# Patient Record
Sex: Female | Born: 1945 | Race: White | Hispanic: No | Marital: Married | Smoking: Never smoker
Health system: Southern US, Community
[De-identification: ages and names within clinical notes are randomized; demographics above are authoritative.]

## PROBLEM LIST (undated history)

## (undated) DIAGNOSIS — M810 Age-related osteoporosis without current pathological fracture: Secondary | ICD-10-CM

## (undated) HISTORY — PX: OTHER SURGICAL HISTORY: SHX169

---

## 1999-07-23 ENCOUNTER — Other Ambulatory Visit: Admission: RE | Admit: 1999-07-23 | Discharge: 1999-07-23 | Payer: Self-pay | Admitting: *Deleted

## 2001-07-21 ENCOUNTER — Other Ambulatory Visit: Admission: RE | Admit: 2001-07-21 | Discharge: 2001-07-21 | Payer: Self-pay | Admitting: *Deleted

## 2006-08-03 ENCOUNTER — Encounter: Admission: RE | Admit: 2006-08-03 | Discharge: 2006-08-03 | Payer: Self-pay | Admitting: Family Medicine

## 2006-08-29 ENCOUNTER — Encounter: Admission: RE | Admit: 2006-08-29 | Discharge: 2006-08-29 | Payer: Self-pay | Admitting: Orthopedic Surgery

## 2007-05-17 ENCOUNTER — Encounter: Admission: RE | Admit: 2007-05-17 | Discharge: 2007-05-17 | Payer: Self-pay | Admitting: Family Medicine

## 2009-11-21 ENCOUNTER — Ambulatory Visit: Payer: Self-pay | Admitting: Oncology

## 2009-12-27 ENCOUNTER — Ambulatory Visit: Payer: Self-pay | Admitting: Oncology

## 2011-08-16 ENCOUNTER — Inpatient Hospital Stay (INDEPENDENT_AMBULATORY_CARE_PROVIDER_SITE_OTHER)
Admission: RE | Admit: 2011-08-16 | Discharge: 2011-08-16 | Disposition: A | Discharge status: Home / Self Care | Payer: BC Managed Care – PPO | Source: Ambulatory Visit | Attending: Emergency Medicine | Admitting: Emergency Medicine

## 2011-08-16 ENCOUNTER — Ambulatory Visit (INDEPENDENT_AMBULATORY_CARE_PROVIDER_SITE_OTHER): Payer: BC Managed Care – PPO

## 2011-08-16 DIAGNOSIS — S8000XA Contusion of unspecified knee, initial encounter: Secondary | ICD-10-CM

## 2013-08-19 ENCOUNTER — Encounter (HOSPITAL_COMMUNITY): Payer: Self-pay | Admitting: Emergency Medicine

## 2013-08-19 ENCOUNTER — Emergency Department (INDEPENDENT_AMBULATORY_CARE_PROVIDER_SITE_OTHER)
Admission: EM | Admit: 2013-08-19 | Discharge: 2013-08-19 | Disposition: A | Discharge status: Home / Self Care | Payer: Medicare Other | Source: Home / Self Care

## 2013-08-19 ENCOUNTER — Emergency Department (INDEPENDENT_AMBULATORY_CARE_PROVIDER_SITE_OTHER): Payer: Medicare Other

## 2013-08-19 DIAGNOSIS — M47814 Spondylosis without myelopathy or radiculopathy, thoracic region: Secondary | ICD-10-CM

## 2013-08-19 DIAGNOSIS — S139XXA Sprain of joints and ligaments of unspecified parts of neck, initial encounter: Secondary | ICD-10-CM

## 2013-08-19 DIAGNOSIS — S239XXA Sprain of unspecified parts of thorax, initial encounter: Secondary | ICD-10-CM

## 2013-08-19 DIAGNOSIS — M47812 Spondylosis without myelopathy or radiculopathy, cervical region: Secondary | ICD-10-CM

## 2013-08-19 DIAGNOSIS — S134XXA Sprain of ligaments of cervical spine, initial encounter: Secondary | ICD-10-CM

## 2013-08-19 MED ORDER — CYCLOBENZAPRINE HCL 10 MG PO TABS: 10.0000 mg | ORAL_TABLET | Freq: Two times a day (BID) | ORAL | Status: DC | PRN

## 2013-08-19 MED ORDER — NAPROXEN 500 MG PO TABS: ORAL_TABLET | ORAL | Status: DC
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
## 2013-08-23 NOTE — ED Provider Notes (Signed)
Medical screening examination/treatment/procedure(s) were performed by resident physician or non-physician practitioner and as supervising physician I was immediately available for consultation/collaboration.   Jeneen Doutt DOUGLAS MD.   Dixie Coppa D Tiyona Desouza, MD 08/23/13 1454 

## 2013-10-29 ENCOUNTER — Ambulatory Visit (INDEPENDENT_AMBULATORY_CARE_PROVIDER_SITE_OTHER): Payer: Medicare Other | Admitting: Internal Medicine

## 2013-10-29 VITALS — BP 180/90 | HR 90 | Temp 98.1°F | Resp 18 | Ht 62.0 in | Wt 191.0 lb

## 2013-10-29 DIAGNOSIS — R519 Headache, unspecified: Secondary | ICD-10-CM

## 2013-10-29 DIAGNOSIS — R202 Paresthesia of skin: Secondary | ICD-10-CM

## 2013-10-29 DIAGNOSIS — R51 Headache: Secondary | ICD-10-CM

## 2013-10-29 DIAGNOSIS — L02219 Cutaneous abscess of trunk, unspecified: Secondary | ICD-10-CM

## 2013-10-29 DIAGNOSIS — L02215 Cutaneous abscess of perineum: Secondary | ICD-10-CM

## 2013-10-29 DIAGNOSIS — R209 Unspecified disturbances of skin sensation: Secondary | ICD-10-CM

## 2013-10-29 MED ORDER — DOXYCYCLINE HYCLATE 100 MG PO TABS: 100.0000 mg | ORAL_TABLET | Freq: Two times a day (BID) | ORAL | Status: DC

## 2013-10-29 NOTE — Progress Notes (Signed)
Subjective:    Patient ID: Jo Simpson, female    DOB: 1946/11/08, 67 y.o.   MRN: [id number]  This chart was scribed for Ellamae Sia, MD by Greggory Stallion, Medical Scribe. This patient's care was started at 5:09 PM.  HPI HPI Comments: Jo Simpson is a 67 y.o. female who presents to the office complaining of sudden onset headache that started 2 days ago-R frontotemp. No dizz/vis chg/nausea/vom.No hx HAs. Lasted 1-2 hrs. She states it went away on it's own. Denies fever, cough, SOB, dizziness, nausea, visual disturbance. She also noted bilateral hand pain, shoulder pain, burning left hip pain and neck pain, and She states she had temporary tingling in her bilateral lower/upper extremities. All of this resolved on Friday.  Pt states she also has an abscess in the perineal area x 1 week. She states she scratched it and the abscess popped and drained. Pt putting antibiotic ointment on it.  She states she is also having a mild rash on her right axilla.not pruritic or painful.   There are no active problems to display for this patient.  History reviewed. No pertinent past medical history. Past Surgical History  Procedure Laterality Date   Tonsilittis     Allergies  Allergen Reactions   Sulfa Antibiotics    Prior to Admission medications   Medication Sig Start Date End Date Taking? Authorizing Provider  ibuprofen (ADVIL,MOTRIN) 800 MG tablet Take 800 mg by mouth every 8 (eight) hours as needed for pain.   Yes Historical Provider, MD  naproxen (NAPROSYN) 500 MG tablet 1 tab po bid pc x 7 days then prn neck or back pain 08/19/13   Riki Sheer, PA-C   History   Social History   Marital Status: Married    Spouse Name: N/A    Number of Children: N/A   Years of Education: N/A   Occupational History   Not on file.   Social History Main Topics   Smoking status: Never Smoker    Smokeless tobacco: Not on file   Alcohol Use: No   Drug Use: No   Sexual Activity:  No   Other Topics Concern   Not on file   Social History Narrative   No narrative on file    Review of Systems  Constitutional: Negative for fever.  Eyes: Negative for visual disturbance.  Respiratory: Negative for cough and shortness of breath.   Gastrointestinal: Negative for nausea.  Musculoskeletal: Positive for arthralgias and neck pain.  Skin: Positive for rash.       Abscess  Neurological: Positive for headaches. Negative for dizziness.       Objective:   Physical Exam  Vitals reviewed. Constitutional: She is oriented to person, place, and time. She appears well-developed and well-nourished. No distress.  HENT:  Head: Normocephalic and atraumatic.  Eyes: Conjunctivae and EOM are normal. Pupils are equal, round, and reactive to light.  Neck: Neck supple. No thyromegaly present.  Cardiovascular: Normal rate, regular rhythm, normal heart sounds and intact distal pulses.   No murmur heard. Pulmonary/Chest: Effort normal and breath sounds normal. No respiratory distress.  Abdominal: Soft. Bowel sounds are normal. She exhibits no mass. There is no tenderness.  Musculoskeletal: Normal range of motion. She exhibits no edema.  Lymphadenopathy:    She has no cervical adenopathy.  Neurological: She is alert and oriented to person, place, and time. She has normal reflexes. No cranial nerve deficit. She exhibits normal muscle tone. Coordination normal.  Skin: Skin is  warm and dry.  1 cm open, draining lesion on perineum that is slightly tender.   Psychiatric: Her behavior is normal. Thought content normal.  anxious  no rash in axilla.  Filed Vitals:   10/29/13 1646  BP: 180/90  Pulse: 90  Temp: 98.1 F (36.7 C)  TempSrc: Oral  Resp: 18  Height: 5\' 2"  (1.575 m)  Weight: 191 lb (86.637 kg)  SpO2: 97%      Assessment & Plan:  5:18 PM-Discussed treatment plan which includes an antibiotic with pt at bedside and pt agreed to plan.    I have completed the patient  encounter in its entirety as documented by the scribe, with editing by me where necessary. Robert P. Merla Riches, M.D.   Abscess, perineum--doxy/hot soaks  HA (headache)--f/u if recurs  Paresthesia-ditto  Incr BP ?anxious---reck when asymptomatic

## 2013-11-06 ENCOUNTER — Ambulatory Visit (HOSPITAL_COMMUNITY)
Admission: RE | Admit: 2013-11-06 | Discharge: 2013-11-06 | Disposition: A | Discharge status: Home / Self Care | Payer: Medicare Other | Source: Ambulatory Visit | Attending: Chiropractic Medicine | Admitting: Chiropractic Medicine

## 2013-11-06 ENCOUNTER — Other Ambulatory Visit (HOSPITAL_COMMUNITY): Payer: Self-pay | Admitting: Chiropractic Medicine

## 2013-11-06 DIAGNOSIS — M25531 Pain in right wrist: Secondary | ICD-10-CM

## 2013-11-06 DIAGNOSIS — M25532 Pain in left wrist: Secondary | ICD-10-CM

## 2013-11-06 DIAGNOSIS — M810 Age-related osteoporosis without current pathological fracture: Secondary | ICD-10-CM | POA: Insufficient documentation

## 2013-11-06 DIAGNOSIS — M19039 Primary osteoarthritis, unspecified wrist: Secondary | ICD-10-CM | POA: Insufficient documentation

## 2014-09-14 ENCOUNTER — Other Ambulatory Visit: Payer: Self-pay
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
## 2015-02-08 ENCOUNTER — Emergency Department (HOSPITAL_COMMUNITY)
Admission: EM | Admit: 2015-02-08 | Discharge: 2015-02-08 | Disposition: A | Discharge status: Home / Self Care | Payer: PRIVATE HEALTH INSURANCE | Source: Home / Self Care | Attending: Family Medicine | Admitting: Family Medicine

## 2015-02-08 ENCOUNTER — Encounter (HOSPITAL_COMMUNITY): Payer: Self-pay

## 2015-02-08 DIAGNOSIS — S0081XA Abrasion of other part of head, initial encounter: Secondary | ICD-10-CM | POA: Diagnosis not present

## 2015-02-08 DIAGNOSIS — W19XXXA Unspecified fall, initial encounter: Secondary | ICD-10-CM

## 2015-02-08 MED ORDER — TETANUS-DIPHTH-ACELL PERTUSSIS 5-2.5-18.5 LF-MCG/0.5 IM SUSP
0.5000 mL | Freq: Once | INTRAMUSCULAR | Status: AC
  Administered 2015-02-08: 0.5 mL via INTRAMUSCULAR

## 2015-02-08 MED ORDER — TETANUS-DIPHTH-ACELL PERTUSSIS 5-2.5-18.5 LF-MCG/0.5 IM SUSP
INTRAMUSCULAR | Status: AC
  Filled 2015-02-08: qty 0.5

## 2015-02-08 NOTE — Discharge Instructions (Signed)
Keep chin clean with soap and water gently, use neosporin, you had a tetanus booster, return as needed.

## 2015-02-08 NOTE — ED Provider Notes (Signed)
CSN: [id number]     Arrival date & time 02/08/15  1212 History   First MD Initiated Contact with Patient 02/08/15 1343     No chief complaint on file.  (Consider location/radiation/quality/duration/timing/severity/associated sxs/prior Treatment) Patient is a 69 y.o. female presenting with fall. The history is provided by the patient.  Fall This is a new problem. The current episode started 1 to 2 hours ago (tripped on curb while walking dogs and fell onto chin , knee and hands., had bleeding from chin.). The problem has been gradually improving. Pertinent negatives include no chest pain, no abdominal pain, no headaches and no shortness of breath.    History reviewed. No pertinent past medical history. Past Surgical History  Procedure Laterality Date  . Tonsilittis     Family History  Problem Relation Age of Onset  . Heart disease Father    History  Substance Use Topics  . Smoking status: Never Smoker   . Smokeless tobacco: Not on file  . Alcohol Use: No   OB History    No data available     Review of Systems  Constitutional: Negative.   Respiratory: Negative for shortness of breath.   Cardiovascular: Negative for chest pain.  Gastrointestinal: Negative for abdominal pain.  Musculoskeletal: Negative for back pain and neck pain.  Skin: Positive for wound.  Neurological: Negative for dizziness, light-headedness and headaches.    Allergies  Sulfa antibiotics  Home Medications   Prior to Admission medications   Medication Sig Start Date End Date Taking? Authorizing Provider  doxycycline (VIBRA-TABS) 100 MG tablet Take 1 tablet (100 mg total) by mouth 2 (two) times daily. 10/29/13   Tonye Pearsonobert P Doolittle, MD  ibuprofen (ADVIL,MOTRIN) 800 MG tablet Take 800 mg by mouth every 8 (eight) hours as needed for pain.    Historical Provider, MD  naproxen (NAPROSYN) 500 MG tablet 1 tab po bid pc x 7 days then prn neck or back pain 08/19/13   Riki SheerMichelle G Young, PA-C   BP 169/96 mmHg   Pulse 93  Temp(Src) 98.2 F (36.8 C) (Oral)  Resp 14  SpO2 98% Physical Exam  Constitutional: She is oriented to person, place, and time. She appears well-developed and well-nourished.  HENT:  Head: Normocephalic.  Mouth/Throat: Oropharynx is clear and moist.  1mm abrasion to chin , no bleeding, no fb palpated or seen, teeth and mandible intact, no tmj pain..  Neck: Normal range of motion. Neck supple.  Pulmonary/Chest: She exhibits no tenderness.  Abdominal: There is no tenderness.  Musculoskeletal: She exhibits no tenderness.  Neurological: She is alert and oriented to person, place, and time.  Skin: Skin is warm and dry.  Nursing note and vitals reviewed.   ED Course  Procedures (including critical care time) Labs Review Labs Reviewed - No data to display  Imaging Review No results found.   MDM   1. Fall, initial encounter       Linna HoffJames D Cordae Mccarey, MD 02/08/15 1431

## 2015-02-08 NOTE — ED Notes (Signed)
Reportedly fell this AM while out walking her 2 dogs , impacting her chin. Small abrasion noted . C/o some pain in both hands, both knees.pain in right TMJ area, but opens/closes mouth freely  Ambulates w/o observable deficit denies LOC

## 2015-05-27 ENCOUNTER — Other Ambulatory Visit: Payer: Self-pay

## 2019-08-29 ENCOUNTER — Other Ambulatory Visit: Payer: Self-pay

## 2019-08-29 DIAGNOSIS — Z20822 Contact with and (suspected) exposure to covid-19: Secondary | ICD-10-CM

## 2019-08-30 LAB — NOVEL CORONAVIRUS, NAA: SARS-CoV-2, NAA: NOT DETECTED

## 2019-12-25 ENCOUNTER — Ambulatory Visit: Payer: Medicare PPO | Attending: Internal Medicine

## 2019-12-25 DIAGNOSIS — Z23 Encounter for immunization: Secondary | ICD-10-CM | POA: Insufficient documentation

## 2019-12-25 NOTE — Progress Notes (Signed)
   Covid-19 Vaccination Clinic  Name:  Jo Simpson    MRN: [id number] DOB: 10/17/1946  12/25/2019  Jo Simpson was observed post Covid-19 immunization for 15 minutes without incidence. She was provided with Vaccine Information Sheet and instruction to access the V-Safe system.   Jo Simpson was instructed to call 911 with any severe reactions post vaccine: Marland Kitchen Difficulty breathing  . Swelling of your face and throat  . A fast heartbeat  . A bad rash all over your body  . Dizziness and weakness    Immunizations Administered    Name Date Dose VIS Date Route   Pfizer COVID-19 Vaccine 12/25/2019 10:10 AM 0.3 mL 11/10/2019 Intramuscular   Manufacturer: [employer], [employer]   Lot: [id number]   NDC: [address]-4

## 2020-01-15 ENCOUNTER — Ambulatory Visit: Payer: Medicare PPO | Attending: Internal Medicine

## 2020-01-15 DIAGNOSIS — Z23 Encounter for immunization: Secondary | ICD-10-CM | POA: Insufficient documentation

## 2020-01-15 NOTE — Progress Notes (Signed)
   Covid-19 Vaccination Clinic  Name:  Jo Simpson    MRN: [id number] DOB: 07/04/1946  01/15/2020  [name] was observed post Covid-19 immunization for 15 minutes without incidence. She was provided with Vaccine Information Sheet and instruction to access the V-Safe system.   [name] was instructed to call 911 with any severe reactions post vaccine: Marland Kitchen Difficulty breathing  . Swelling of your face and throat  . A fast heartbeat  . A bad rash all over your body  . Dizziness and weakness    Immunizations Administered    Name Date Dose VIS Date Route   Pfizer COVID-19 Vaccine 01/15/2020 10:33 AM 0.3 mL 11/10/2019 Intramuscular   Manufacturer: [employer], [employer]   Lot: [id number]   NDC: [address]-3

## 2020-08-20 DIAGNOSIS — L814 Other melanin hyperpigmentation: Secondary | ICD-10-CM | POA: Diagnosis not present

## 2020-08-20 DIAGNOSIS — Z86018 Personal history of other benign neoplasm: Secondary | ICD-10-CM | POA: Diagnosis not present

## 2020-08-20 DIAGNOSIS — L578 Other skin changes due to chronic exposure to nonionizing radiation: Secondary | ICD-10-CM | POA: Diagnosis not present

## 2020-08-20 DIAGNOSIS — L821 Other seborrheic keratosis: Secondary | ICD-10-CM | POA: Diagnosis not present

## 2020-08-20 DIAGNOSIS — B353 Tinea pedis: Secondary | ICD-10-CM | POA: Diagnosis not present

## 2020-08-20 DIAGNOSIS — D225 Melanocytic nevi of trunk: Secondary | ICD-10-CM | POA: Diagnosis not present

## 2020-09-07 DIAGNOSIS — Z23 Encounter for immunization: Secondary | ICD-10-CM | POA: Diagnosis not present

## 2020-09-11 DIAGNOSIS — M9903 Segmental and somatic dysfunction of lumbar region: Secondary | ICD-10-CM | POA: Diagnosis not present

## 2020-09-11 DIAGNOSIS — M5432 Sciatica, left side: Secondary | ICD-10-CM | POA: Diagnosis not present

## 2020-09-12 DIAGNOSIS — M9903 Segmental and somatic dysfunction of lumbar region: Secondary | ICD-10-CM | POA: Diagnosis not present

## 2020-09-12 DIAGNOSIS — M5432 Sciatica, left side: Secondary | ICD-10-CM | POA: Diagnosis not present

## 2020-09-16 DIAGNOSIS — M9903 Segmental and somatic dysfunction of lumbar region: Secondary | ICD-10-CM | POA: Diagnosis not present

## 2020-09-16 DIAGNOSIS — M5432 Sciatica, left side: Secondary | ICD-10-CM | POA: Diagnosis not present

## 2020-09-17 DIAGNOSIS — M5432 Sciatica, left side: Secondary | ICD-10-CM | POA: Diagnosis not present

## 2020-09-17 DIAGNOSIS — M9903 Segmental and somatic dysfunction of lumbar region: Secondary | ICD-10-CM | POA: Diagnosis not present

## 2020-09-18 DIAGNOSIS — M9903 Segmental and somatic dysfunction of lumbar region: Secondary | ICD-10-CM | POA: Diagnosis not present

## 2020-09-18 DIAGNOSIS — M5432 Sciatica, left side: Secondary | ICD-10-CM | POA: Diagnosis not present

## 2020-09-23 DIAGNOSIS — M5432 Sciatica, left side: Secondary | ICD-10-CM | POA: Diagnosis not present

## 2020-09-23 DIAGNOSIS — M9903 Segmental and somatic dysfunction of lumbar region: Secondary | ICD-10-CM | POA: Diagnosis not present

## 2020-09-25 DIAGNOSIS — M9903 Segmental and somatic dysfunction of lumbar region: Secondary | ICD-10-CM | POA: Diagnosis not present

## 2020-09-25 DIAGNOSIS — M5432 Sciatica, left side: Secondary | ICD-10-CM | POA: Diagnosis not present

## 2020-09-30 DIAGNOSIS — M9903 Segmental and somatic dysfunction of lumbar region: Secondary | ICD-10-CM | POA: Diagnosis not present

## 2020-09-30 DIAGNOSIS — M5432 Sciatica, left side: Secondary | ICD-10-CM | POA: Diagnosis not present

## 2020-10-03 DIAGNOSIS — M9903 Segmental and somatic dysfunction of lumbar region: Secondary | ICD-10-CM | POA: Diagnosis not present

## 2020-10-03 DIAGNOSIS — M5432 Sciatica, left side: Secondary | ICD-10-CM | POA: Diagnosis not present

## 2020-10-07 DIAGNOSIS — R350 Frequency of micturition: Secondary | ICD-10-CM | POA: Diagnosis not present

## 2020-10-07 DIAGNOSIS — M9903 Segmental and somatic dysfunction of lumbar region: Secondary | ICD-10-CM | POA: Diagnosis not present

## 2020-10-07 DIAGNOSIS — M5432 Sciatica, left side: Secondary | ICD-10-CM | POA: Diagnosis not present

## 2020-10-10 DIAGNOSIS — M9903 Segmental and somatic dysfunction of lumbar region: Secondary | ICD-10-CM | POA: Diagnosis not present

## 2020-10-10 DIAGNOSIS — M5432 Sciatica, left side: Secondary | ICD-10-CM | POA: Diagnosis not present

## 2020-10-15 DIAGNOSIS — M5432 Sciatica, left side: Secondary | ICD-10-CM | POA: Diagnosis not present

## 2020-10-15 DIAGNOSIS — M9903 Segmental and somatic dysfunction of lumbar region: Secondary | ICD-10-CM | POA: Diagnosis not present

## 2020-11-12 DIAGNOSIS — M5432 Sciatica, left side: Secondary | ICD-10-CM | POA: Diagnosis not present

## 2020-11-12 DIAGNOSIS — M9903 Segmental and somatic dysfunction of lumbar region: Secondary | ICD-10-CM | POA: Diagnosis not present

## 2020-12-09 DIAGNOSIS — M5432 Sciatica, left side: Secondary | ICD-10-CM | POA: Diagnosis not present

## 2020-12-09 DIAGNOSIS — M9903 Segmental and somatic dysfunction of lumbar region: Secondary | ICD-10-CM | POA: Diagnosis not present

## 2020-12-27 DIAGNOSIS — E782 Mixed hyperlipidemia: Secondary | ICD-10-CM | POA: Diagnosis not present

## 2020-12-27 DIAGNOSIS — R030 Elevated blood-pressure reading, without diagnosis of hypertension: Secondary | ICD-10-CM | POA: Diagnosis not present

## 2021-01-07 DIAGNOSIS — M9903 Segmental and somatic dysfunction of lumbar region: Secondary | ICD-10-CM | POA: Diagnosis not present

## 2021-01-07 DIAGNOSIS — M5432 Sciatica, left side: Secondary | ICD-10-CM | POA: Diagnosis not present

## 2021-01-22 DIAGNOSIS — Z1231 Encounter for screening mammogram for malignant neoplasm of breast: Secondary | ICD-10-CM | POA: Diagnosis not present

## 2021-02-18 DIAGNOSIS — M5432 Sciatica, left side: Secondary | ICD-10-CM | POA: Diagnosis not present

## 2021-02-18 DIAGNOSIS — M9903 Segmental and somatic dysfunction of lumbar region: Secondary | ICD-10-CM | POA: Diagnosis not present

## 2021-03-04 DIAGNOSIS — R748 Abnormal levels of other serum enzymes: Secondary | ICD-10-CM | POA: Diagnosis not present

## 2021-03-04 DIAGNOSIS — Z1211 Encounter for screening for malignant neoplasm of colon: Secondary | ICD-10-CM | POA: Diagnosis not present

## 2021-03-04 DIAGNOSIS — K5730 Diverticulosis of large intestine without perforation or abscess without bleeding: Secondary | ICD-10-CM | POA: Diagnosis not present

## 2021-03-19 DIAGNOSIS — Z1211 Encounter for screening for malignant neoplasm of colon: Secondary | ICD-10-CM | POA: Diagnosis not present

## 2021-03-19 DIAGNOSIS — Z1212 Encounter for screening for malignant neoplasm of rectum: Secondary | ICD-10-CM | POA: Diagnosis not present

## 2021-03-24 LAB — EXTERNAL GENERIC LAB PROCEDURE: COLOGUARD: NEGATIVE

## 2021-03-24 LAB — COLOGUARD: COLOGUARD: NEGATIVE

## 2021-04-01 DIAGNOSIS — M9903 Segmental and somatic dysfunction of lumbar region: Secondary | ICD-10-CM | POA: Diagnosis not present

## 2021-04-01 DIAGNOSIS — M5432 Sciatica, left side: Secondary | ICD-10-CM | POA: Diagnosis not present

## 2021-04-03 DIAGNOSIS — H2513 Age-related nuclear cataract, bilateral: Secondary | ICD-10-CM | POA: Diagnosis not present

## 2021-04-03 DIAGNOSIS — H5203 Hypermetropia, bilateral: Secondary | ICD-10-CM | POA: Diagnosis not present

## 2021-04-03 DIAGNOSIS — H524 Presbyopia: Secondary | ICD-10-CM | POA: Diagnosis not present

## 2021-04-03 DIAGNOSIS — H53001 Unspecified amblyopia, right eye: Secondary | ICD-10-CM | POA: Diagnosis not present

## 2021-04-03 DIAGNOSIS — H52223 Regular astigmatism, bilateral: Secondary | ICD-10-CM | POA: Diagnosis not present

## 2021-05-13 DIAGNOSIS — M5432 Sciatica, left side: Secondary | ICD-10-CM | POA: Diagnosis not present

## 2021-05-13 DIAGNOSIS — M9903 Segmental and somatic dysfunction of lumbar region: Secondary | ICD-10-CM | POA: Diagnosis not present

## 2021-05-16 DIAGNOSIS — Z20822 Contact with and (suspected) exposure to covid-19: Secondary | ICD-10-CM | POA: Diagnosis not present

## 2021-06-16 DIAGNOSIS — Z1389 Encounter for screening for other disorder: Secondary | ICD-10-CM | POA: Diagnosis not present

## 2021-06-16 DIAGNOSIS — Z0000 Encounter for general adult medical examination without abnormal findings: Secondary | ICD-10-CM | POA: Diagnosis not present

## 2021-06-16 DIAGNOSIS — E782 Mixed hyperlipidemia: Secondary | ICD-10-CM | POA: Diagnosis not present

## 2021-06-17 DIAGNOSIS — M9903 Segmental and somatic dysfunction of lumbar region: Secondary | ICD-10-CM | POA: Diagnosis not present

## 2021-06-17 DIAGNOSIS — M5432 Sciatica, left side: Secondary | ICD-10-CM | POA: Diagnosis not present

## 2021-06-21 DIAGNOSIS — Z20822 Contact with and (suspected) exposure to covid-19: Secondary | ICD-10-CM | POA: Diagnosis not present

## 2021-07-22 DIAGNOSIS — M9903 Segmental and somatic dysfunction of lumbar region: Secondary | ICD-10-CM | POA: Diagnosis not present

## 2021-07-22 DIAGNOSIS — M5432 Sciatica, left side: Secondary | ICD-10-CM | POA: Diagnosis not present

## 2021-08-19 DIAGNOSIS — M9903 Segmental and somatic dysfunction of lumbar region: Secondary | ICD-10-CM | POA: Diagnosis not present

## 2021-08-19 DIAGNOSIS — M5432 Sciatica, left side: Secondary | ICD-10-CM | POA: Diagnosis not present

## 2021-08-20 DIAGNOSIS — Z86018 Personal history of other benign neoplasm: Secondary | ICD-10-CM | POA: Diagnosis not present

## 2021-08-20 DIAGNOSIS — L814 Other melanin hyperpigmentation: Secondary | ICD-10-CM | POA: Diagnosis not present

## 2021-08-20 DIAGNOSIS — L821 Other seborrheic keratosis: Secondary | ICD-10-CM | POA: Diagnosis not present

## 2021-08-20 DIAGNOSIS — L578 Other skin changes due to chronic exposure to nonionizing radiation: Secondary | ICD-10-CM | POA: Diagnosis not present

## 2021-08-20 DIAGNOSIS — D225 Melanocytic nevi of trunk: Secondary | ICD-10-CM | POA: Diagnosis not present

## 2021-08-20 DIAGNOSIS — L820 Inflamed seborrheic keratosis: Secondary | ICD-10-CM | POA: Diagnosis not present

## 2021-09-15 DIAGNOSIS — M9903 Segmental and somatic dysfunction of lumbar region: Secondary | ICD-10-CM | POA: Diagnosis not present

## 2021-09-15 DIAGNOSIS — M5432 Sciatica, left side: Secondary | ICD-10-CM | POA: Diagnosis not present

## 2021-09-17 DIAGNOSIS — M9903 Segmental and somatic dysfunction of lumbar region: Secondary | ICD-10-CM | POA: Diagnosis not present

## 2021-09-17 DIAGNOSIS — M5432 Sciatica, left side: Secondary | ICD-10-CM | POA: Diagnosis not present

## 2021-09-22 DIAGNOSIS — M9903 Segmental and somatic dysfunction of lumbar region: Secondary | ICD-10-CM | POA: Diagnosis not present

## 2021-09-22 DIAGNOSIS — M5432 Sciatica, left side: Secondary | ICD-10-CM | POA: Diagnosis not present

## 2021-09-25 DIAGNOSIS — M9903 Segmental and somatic dysfunction of lumbar region: Secondary | ICD-10-CM | POA: Diagnosis not present

## 2021-09-25 DIAGNOSIS — M5432 Sciatica, left side: Secondary | ICD-10-CM | POA: Diagnosis not present

## 2021-09-29 DIAGNOSIS — H9311 Tinnitus, right ear: Secondary | ICD-10-CM | POA: Diagnosis not present

## 2021-09-29 DIAGNOSIS — H6123 Impacted cerumen, bilateral: Secondary | ICD-10-CM | POA: Diagnosis not present

## 2021-09-30 DIAGNOSIS — M9903 Segmental and somatic dysfunction of lumbar region: Secondary | ICD-10-CM | POA: Diagnosis not present

## 2021-09-30 DIAGNOSIS — M5432 Sciatica, left side: Secondary | ICD-10-CM | POA: Diagnosis not present

## 2021-10-04 DIAGNOSIS — Z23 Encounter for immunization: Secondary | ICD-10-CM | POA: Diagnosis not present

## 2021-10-07 DIAGNOSIS — M9903 Segmental and somatic dysfunction of lumbar region: Secondary | ICD-10-CM | POA: Diagnosis not present

## 2021-10-07 DIAGNOSIS — M5432 Sciatica, left side: Secondary | ICD-10-CM | POA: Diagnosis not present

## 2021-10-14 DIAGNOSIS — M9903 Segmental and somatic dysfunction of lumbar region: Secondary | ICD-10-CM | POA: Diagnosis not present

## 2021-10-14 DIAGNOSIS — M5432 Sciatica, left side: Secondary | ICD-10-CM | POA: Diagnosis not present

## 2021-10-28 DIAGNOSIS — M9903 Segmental and somatic dysfunction of lumbar region: Secondary | ICD-10-CM | POA: Diagnosis not present

## 2021-10-28 DIAGNOSIS — M5432 Sciatica, left side: Secondary | ICD-10-CM | POA: Diagnosis not present

## 2021-11-11 DIAGNOSIS — M5432 Sciatica, left side: Secondary | ICD-10-CM | POA: Diagnosis not present

## 2021-11-11 DIAGNOSIS — M9903 Segmental and somatic dysfunction of lumbar region: Secondary | ICD-10-CM | POA: Diagnosis not present

## 2021-12-02 DIAGNOSIS — M5432 Sciatica, left side: Secondary | ICD-10-CM | POA: Diagnosis not present

## 2021-12-02 DIAGNOSIS — M9903 Segmental and somatic dysfunction of lumbar region: Secondary | ICD-10-CM | POA: Diagnosis not present

## 2021-12-16 DIAGNOSIS — M9903 Segmental and somatic dysfunction of lumbar region: Secondary | ICD-10-CM | POA: Diagnosis not present

## 2021-12-16 DIAGNOSIS — M5432 Sciatica, left side: Secondary | ICD-10-CM | POA: Diagnosis not present

## 2021-12-30 DIAGNOSIS — M5432 Sciatica, left side: Secondary | ICD-10-CM | POA: Diagnosis not present

## 2021-12-30 DIAGNOSIS — M9903 Segmental and somatic dysfunction of lumbar region: Secondary | ICD-10-CM | POA: Diagnosis not present

## 2022-01-20 DIAGNOSIS — M9903 Segmental and somatic dysfunction of lumbar region: Secondary | ICD-10-CM | POA: Diagnosis not present

## 2022-01-20 DIAGNOSIS — M5432 Sciatica, left side: Secondary | ICD-10-CM | POA: Diagnosis not present

## 2022-01-28 DIAGNOSIS — Z1231 Encounter for screening mammogram for malignant neoplasm of breast: Secondary | ICD-10-CM | POA: Diagnosis not present

## 2022-02-10 DIAGNOSIS — M9903 Segmental and somatic dysfunction of lumbar region: Secondary | ICD-10-CM | POA: Diagnosis not present

## 2022-02-10 DIAGNOSIS — M5432 Sciatica, left side: Secondary | ICD-10-CM | POA: Diagnosis not present

## 2022-03-03 DIAGNOSIS — M5432 Sciatica, left side: Secondary | ICD-10-CM | POA: Diagnosis not present

## 2022-03-03 DIAGNOSIS — M9903 Segmental and somatic dysfunction of lumbar region: Secondary | ICD-10-CM | POA: Diagnosis not present

## 2022-03-25 DIAGNOSIS — M9903 Segmental and somatic dysfunction of lumbar region: Secondary | ICD-10-CM | POA: Diagnosis not present

## 2022-03-25 DIAGNOSIS — M5432 Sciatica, left side: Secondary | ICD-10-CM | POA: Diagnosis not present

## 2022-04-22 DIAGNOSIS — M5432 Sciatica, left side: Secondary | ICD-10-CM | POA: Diagnosis not present

## 2022-04-22 DIAGNOSIS — M9903 Segmental and somatic dysfunction of lumbar region: Secondary | ICD-10-CM | POA: Diagnosis not present

## 2022-06-25 DIAGNOSIS — Z1331 Encounter for screening for depression: Secondary | ICD-10-CM | POA: Diagnosis not present

## 2022-06-25 DIAGNOSIS — M8588 Other specified disorders of bone density and structure, other site: Secondary | ICD-10-CM | POA: Diagnosis not present

## 2022-06-25 DIAGNOSIS — E782 Mixed hyperlipidemia: Secondary | ICD-10-CM | POA: Diagnosis not present

## 2022-06-25 DIAGNOSIS — Z0000 Encounter for general adult medical examination without abnormal findings: Secondary | ICD-10-CM | POA: Diagnosis not present

## 2022-06-25 DIAGNOSIS — I499 Cardiac arrhythmia, unspecified: Secondary | ICD-10-CM | POA: Diagnosis not present

## 2022-06-25 DIAGNOSIS — R030 Elevated blood-pressure reading, without diagnosis of hypertension: Secondary | ICD-10-CM | POA: Diagnosis not present

## 2022-08-18 DIAGNOSIS — H52223 Regular astigmatism, bilateral: Secondary | ICD-10-CM | POA: Diagnosis not present

## 2022-08-18 DIAGNOSIS — H53143 Visual discomfort, bilateral: Secondary | ICD-10-CM | POA: Diagnosis not present

## 2022-08-18 DIAGNOSIS — H524 Presbyopia: Secondary | ICD-10-CM | POA: Diagnosis not present

## 2022-08-18 DIAGNOSIS — H53001 Unspecified amblyopia, right eye: Secondary | ICD-10-CM | POA: Diagnosis not present

## 2022-08-18 DIAGNOSIS — H5203 Hypermetropia, bilateral: Secondary | ICD-10-CM | POA: Diagnosis not present

## 2022-08-18 DIAGNOSIS — H2513 Age-related nuclear cataract, bilateral: Secondary | ICD-10-CM | POA: Diagnosis not present

## 2022-08-26 DIAGNOSIS — Z86018 Personal history of other benign neoplasm: Secondary | ICD-10-CM | POA: Diagnosis not present

## 2022-08-26 DIAGNOSIS — L814 Other melanin hyperpigmentation: Secondary | ICD-10-CM | POA: Diagnosis not present

## 2022-08-26 DIAGNOSIS — L821 Other seborrheic keratosis: Secondary | ICD-10-CM | POA: Diagnosis not present

## 2022-08-26 DIAGNOSIS — D225 Melanocytic nevi of trunk: Secondary | ICD-10-CM | POA: Diagnosis not present

## 2022-08-26 DIAGNOSIS — L820 Inflamed seborrheic keratosis: Secondary | ICD-10-CM | POA: Diagnosis not present

## 2022-08-26 DIAGNOSIS — L578 Other skin changes due to chronic exposure to nonionizing radiation: Secondary | ICD-10-CM | POA: Diagnosis not present

## 2022-10-03 DIAGNOSIS — Z23 Encounter for immunization: Secondary | ICD-10-CM | POA: Diagnosis not present

## 2023-03-15 DIAGNOSIS — A6920 Lyme disease, unspecified: Secondary | ICD-10-CM | POA: Diagnosis not present

## 2023-06-28 DIAGNOSIS — E782 Mixed hyperlipidemia: Secondary | ICD-10-CM | POA: Diagnosis not present

## 2023-06-28 DIAGNOSIS — M8580 Other specified disorders of bone density and structure, unspecified site: Secondary | ICD-10-CM | POA: Diagnosis not present

## 2023-06-28 DIAGNOSIS — Z0000 Encounter for general adult medical examination without abnormal findings: Secondary | ICD-10-CM | POA: Diagnosis not present

## 2023-06-28 DIAGNOSIS — R0602 Shortness of breath: Secondary | ICD-10-CM | POA: Diagnosis not present

## 2023-06-28 DIAGNOSIS — R600 Localized edema: Secondary | ICD-10-CM | POA: Diagnosis not present

## 2023-06-28 DIAGNOSIS — I499 Cardiac arrhythmia, unspecified: Secondary | ICD-10-CM | POA: Diagnosis not present

## 2023-06-28 DIAGNOSIS — R030 Elevated blood-pressure reading, without diagnosis of hypertension: Secondary | ICD-10-CM | POA: Diagnosis not present

## 2023-07-22 DIAGNOSIS — M8588 Other specified disorders of bone density and structure, other site: Secondary | ICD-10-CM | POA: Diagnosis not present

## 2023-07-22 DIAGNOSIS — R29890 Loss of height: Secondary | ICD-10-CM | POA: Diagnosis not present

## 2023-08-04 DIAGNOSIS — M810 Age-related osteoporosis without current pathological fracture: Secondary | ICD-10-CM | POA: Diagnosis not present

## 2023-08-19 DIAGNOSIS — H53143 Visual discomfort, bilateral: Secondary | ICD-10-CM | POA: Diagnosis not present

## 2023-08-19 DIAGNOSIS — H2513 Age-related nuclear cataract, bilateral: Secondary | ICD-10-CM | POA: Diagnosis not present

## 2023-08-19 DIAGNOSIS — H52223 Regular astigmatism, bilateral: Secondary | ICD-10-CM | POA: Diagnosis not present

## 2023-08-19 DIAGNOSIS — H5203 Hypermetropia, bilateral: Secondary | ICD-10-CM | POA: Diagnosis not present

## 2023-08-19 DIAGNOSIS — H524 Presbyopia: Secondary | ICD-10-CM | POA: Diagnosis not present

## 2023-08-19 DIAGNOSIS — H53001 Unspecified amblyopia, right eye: Secondary | ICD-10-CM | POA: Diagnosis not present

## 2023-09-08 DIAGNOSIS — L578 Other skin changes due to chronic exposure to nonionizing radiation: Secondary | ICD-10-CM | POA: Diagnosis not present

## 2023-09-08 DIAGNOSIS — D225 Melanocytic nevi of trunk: Secondary | ICD-10-CM | POA: Diagnosis not present

## 2023-09-08 DIAGNOSIS — Z86018 Personal history of other benign neoplasm: Secondary | ICD-10-CM | POA: Diagnosis not present

## 2023-09-08 DIAGNOSIS — L821 Other seborrheic keratosis: Secondary | ICD-10-CM | POA: Diagnosis not present

## 2023-09-08 DIAGNOSIS — L814 Other melanin hyperpigmentation: Secondary | ICD-10-CM | POA: Diagnosis not present

## 2023-12-29 DIAGNOSIS — R600 Localized edema: Secondary | ICD-10-CM | POA: Diagnosis not present

## 2023-12-29 DIAGNOSIS — M542 Cervicalgia: Secondary | ICD-10-CM | POA: Diagnosis not present

## 2023-12-29 DIAGNOSIS — E782 Mixed hyperlipidemia: Secondary | ICD-10-CM | POA: Diagnosis not present

## 2023-12-29 DIAGNOSIS — M810 Age-related osteoporosis without current pathological fracture: Secondary | ICD-10-CM | POA: Diagnosis not present

## 2024-02-16 DIAGNOSIS — H53001 Unspecified amblyopia, right eye: Secondary | ICD-10-CM | POA: Diagnosis not present

## 2024-02-16 DIAGNOSIS — H53143 Visual discomfort, bilateral: Secondary | ICD-10-CM | POA: Diagnosis not present

## 2024-02-16 DIAGNOSIS — Z1231 Encounter for screening mammogram for malignant neoplasm of breast: Secondary | ICD-10-CM | POA: Diagnosis not present

## 2024-02-16 DIAGNOSIS — H2513 Age-related nuclear cataract, bilateral: Secondary | ICD-10-CM | POA: Diagnosis not present

## 2024-02-21 DIAGNOSIS — R92322 Mammographic fibroglandular density, left breast: Secondary | ICD-10-CM | POA: Diagnosis not present

## 2024-02-21 DIAGNOSIS — N6323 Unspecified lump in the left breast, lower outer quadrant: Secondary | ICD-10-CM | POA: Diagnosis not present

## 2024-04-02 ENCOUNTER — Other Ambulatory Visit: Payer: Self-pay

## 2024-04-02 ENCOUNTER — Emergency Department (HOSPITAL_COMMUNITY)

## 2024-04-02 ENCOUNTER — Encounter (HOSPITAL_COMMUNITY): Payer: Self-pay

## 2024-04-02 ENCOUNTER — Inpatient Hospital Stay (HOSPITAL_COMMUNITY)

## 2024-04-02 ENCOUNTER — Inpatient Hospital Stay (HOSPITAL_COMMUNITY)
Admission: EM | Admit: 2024-04-02 | Discharge: 2024-04-03 | DRG: 163 | Disposition: A | Discharge status: Home / Self Care | Attending: Internal Medicine | Admitting: Internal Medicine

## 2024-04-02 DIAGNOSIS — I82411 Acute embolism and thrombosis of right femoral vein: Secondary | ICD-10-CM | POA: Diagnosis present

## 2024-04-02 DIAGNOSIS — I10 Essential (primary) hypertension: Secondary | ICD-10-CM | POA: Diagnosis not present

## 2024-04-02 DIAGNOSIS — Z7901 Long term (current) use of anticoagulants: Secondary | ICD-10-CM | POA: Diagnosis not present

## 2024-04-02 DIAGNOSIS — I2609 Other pulmonary embolism with acute cor pulmonale: Secondary | ICD-10-CM | POA: Diagnosis not present

## 2024-04-02 DIAGNOSIS — I472 Ventricular tachycardia, unspecified: Secondary | ICD-10-CM | POA: Diagnosis not present

## 2024-04-02 DIAGNOSIS — I82431 Acute embolism and thrombosis of right popliteal vein: Secondary | ICD-10-CM | POA: Diagnosis not present

## 2024-04-02 DIAGNOSIS — R0602 Shortness of breath: Secondary | ICD-10-CM | POA: Diagnosis not present

## 2024-04-02 DIAGNOSIS — I82441 Acute embolism and thrombosis of right tibial vein: Secondary | ICD-10-CM | POA: Diagnosis present

## 2024-04-02 DIAGNOSIS — I82451 Acute embolism and thrombosis of right peroneal vein: Secondary | ICD-10-CM | POA: Diagnosis present

## 2024-04-02 DIAGNOSIS — J9601 Acute respiratory failure with hypoxia: Secondary | ICD-10-CM | POA: Diagnosis present

## 2024-04-02 DIAGNOSIS — R739 Hyperglycemia, unspecified: Secondary | ICD-10-CM | POA: Diagnosis not present

## 2024-04-02 DIAGNOSIS — I2602 Saddle embolus of pulmonary artery with acute cor pulmonale: Secondary | ICD-10-CM | POA: Diagnosis not present

## 2024-04-02 DIAGNOSIS — Z86711 Personal history of pulmonary embolism: Secondary | ICD-10-CM | POA: Diagnosis not present

## 2024-04-02 DIAGNOSIS — R000 Tachycardia, unspecified: Secondary | ICD-10-CM | POA: Diagnosis not present

## 2024-04-02 DIAGNOSIS — I2699 Other pulmonary embolism without acute cor pulmonale: Secondary | ICD-10-CM | POA: Diagnosis not present

## 2024-04-02 DIAGNOSIS — R0689 Other abnormalities of breathing: Secondary | ICD-10-CM | POA: Diagnosis not present

## 2024-04-02 DIAGNOSIS — J929 Pleural plaque without asbestos: Secondary | ICD-10-CM | POA: Diagnosis not present

## 2024-04-02 DIAGNOSIS — J984 Other disorders of lung: Secondary | ICD-10-CM | POA: Diagnosis not present

## 2024-04-02 DIAGNOSIS — K8020 Calculus of gallbladder without cholecystitis without obstruction: Secondary | ICD-10-CM | POA: Diagnosis not present

## 2024-04-02 DIAGNOSIS — Z5181 Encounter for therapeutic drug level monitoring: Secondary | ICD-10-CM

## 2024-04-02 DIAGNOSIS — R062 Wheezing: Secondary | ICD-10-CM | POA: Diagnosis not present

## 2024-04-02 HISTORY — DX: Age-related osteoporosis without current pathological fracture: M81.0

## 2024-04-02 HISTORY — PX: IR THROMBECT VENO MECH MOD SED: IMG2300

## 2024-04-02 HISTORY — PX: IR ANGIOGRAM SELECTIVE EACH ADDITIONAL VESSEL: IMG667

## 2024-04-02 HISTORY — PX: IR US GUIDE VASC ACCESS RIGHT: IMG2390

## 2024-04-02 HISTORY — PX: IR ANGIOGRAM PULMONARY BILATERAL SELECTIVE: IMG664

## 2024-04-02 LAB — CBC WITH DIFFERENTIAL/PLATELET
Abs Immature Granulocytes: 0.08 10*3/uL — ABNORMAL HIGH (ref 0.00–0.07)
Basophils Absolute: 0.1 10*3/uL (ref 0.0–0.1)
Basophils Relative: 0 %
Eosinophils Absolute: 0 10*3/uL (ref 0.0–0.5)
Eosinophils Relative: 0 %
HCT: 47.5 % — ABNORMAL HIGH (ref 36.0–46.0)
Hemoglobin: 15.5 g/dL — ABNORMAL HIGH (ref 12.0–15.0)
Immature Granulocytes: 1 %
Lymphocytes Relative: 10 %
Lymphs Abs: 1.3 10*3/uL (ref 0.7–4.0)
MCH: 31 pg (ref 26.0–34.0)
MCHC: 32.6 g/dL (ref 30.0–36.0)
MCV: 95 fL (ref 80.0–100.0)
Monocytes Absolute: 0.9 10*3/uL (ref 0.1–1.0)
Monocytes Relative: 7 %
Neutro Abs: 10.1 10*3/uL — ABNORMAL HIGH (ref 1.7–7.7)
Neutrophils Relative %: 82 %
Platelets: 186 10*3/uL (ref 150–400)
RBC: 5 MIL/uL (ref 3.87–5.11)
RDW: 12.7 % (ref 11.5–15.5)
WBC: 12.4 10*3/uL — ABNORMAL HIGH (ref 4.0–10.5)
nRBC: 0 % (ref 0.0–0.2)

## 2024-04-02 LAB — BASIC METABOLIC PANEL WITH GFR
Anion gap: 14 (ref 5–15)
BUN: 15 mg/dL (ref 8–23)
CO2: 21 mmol/L — ABNORMAL LOW (ref 22–32)
Calcium: 9.3 mg/dL (ref 8.9–10.3)
Chloride: 104 mmol/L (ref 98–111)
Creatinine, Ser: 1.17 mg/dL — ABNORMAL HIGH (ref 0.44–1.00)
GFR, Estimated: 47 mL/min — ABNORMAL LOW (ref >60)
Glucose, Bld: 225 mg/dL — ABNORMAL HIGH (ref 70–99)
Potassium: 4 mmol/L (ref 3.5–5.1)
Sodium: 139 mmol/L (ref 135–145)

## 2024-04-02 LAB — LACTIC ACID, PLASMA: Lactic Acid, Venous: 2.7 mmol/L (ref 0.5–1.9)

## 2024-04-02 LAB — HEMOGLOBIN A1C
Hgb A1c MFr Bld: 5.2 % (ref 4.8–5.6)
Mean Plasma Glucose: 102.54 mg/dL

## 2024-04-02 LAB — CBG MONITORING, ED: Glucose-Capillary: 121 mg/dL — ABNORMAL HIGH (ref 70–99)

## 2024-04-02 LAB — GLUCOSE, CAPILLARY
Glucose-Capillary: 119 mg/dL — ABNORMAL HIGH (ref 70–99)
Glucose-Capillary: 93 mg/dL (ref 70–99)

## 2024-04-02 LAB — POCT ACTIVATED CLOTTING TIME
Activated Clotting Time: 153 s
Activated Clotting Time: 291 s
Activated Clotting Time: 665 s
Activated Clotting Time: 205 s

## 2024-04-02 LAB — BRAIN NATRIURETIC PEPTIDE: B Natriuretic Peptide: 118 pg/mL — ABNORMAL HIGH (ref 0.0–100.0)

## 2024-04-02 LAB — HEPARIN LEVEL (UNFRACTIONATED): Heparin Unfractionated: >1.1 [IU]/mL — ABNORMAL HIGH (ref 0.30–0.70)

## 2024-04-02 LAB — TROPONIN I (HIGH SENSITIVITY): Troponin I (High Sensitivity): 1516 ng/L (ref <18)

## 2024-04-02 LAB — D-DIMER, QUANTITATIVE: D-Dimer, Quant: 8.66 ug{FEU}/mL — ABNORMAL HIGH (ref 0.00–0.50)

## 2024-04-02 LAB — MRSA NEXT GEN BY PCR, NASAL: MRSA by PCR Next Gen: NOT DETECTED

## 2024-04-02 MED ORDER — FENTANYL CITRATE (PF) 100 MCG/2ML IJ SOLN
INTRAMUSCULAR | Status: AC | PRN
  Administered 2024-04-02: 50 ug via INTRAVENOUS
  Administered 2024-04-02 (×6): 25 ug via INTRAVENOUS

## 2024-04-02 MED ORDER — ORAL CARE MOUTH RINSE: 15.0000 mL | OROMUCOSAL | Status: DC | PRN

## 2024-04-02 MED ORDER — CHLORHEXIDINE GLUCONATE CLOTH 2 % EX PADS: 6.0000 | MEDICATED_PAD | Freq: Every day | CUTANEOUS | Status: DC

## 2024-04-02 MED ORDER — MIDAZOLAM HCL 2 MG/2ML IJ SOLN
INTRAMUSCULAR | Status: AC
  Filled 2024-04-02: qty 2

## 2024-04-02 MED ORDER — INSULIN ASPART 100 UNIT/ML IJ SOLN: 0.0000 [IU] | INTRAMUSCULAR | Status: DC

## 2024-04-02 MED ORDER — FENTANYL CITRATE (PF) 100 MCG/2ML IJ SOLN
INTRAMUSCULAR | Status: AC
Start: 2024-04-02 — End: ?
  Filled 2024-04-02: qty 2

## 2024-04-02 MED ORDER — HEPARIN SODIUM (PORCINE) 1000 UNIT/ML IJ SOLN
INTRAMUSCULAR | Status: AC
  Filled 2024-04-02: qty 10

## 2024-04-02 MED ORDER — IOHEXOL 350 MG/ML SOLN
75.0000 mL | Freq: Once | INTRAVENOUS | Status: AC | PRN
  Administered 2024-04-02: 75 mL via INTRAVENOUS

## 2024-04-02 MED ORDER — MIDAZOLAM HCL 2 MG/2ML IJ SOLN
INTRAMUSCULAR | Status: AC | PRN
  Administered 2024-04-02: 1 mg via INTRAVENOUS
  Administered 2024-04-02 (×2): .5 mg via INTRAVENOUS
  Administered 2024-04-02: 1 mg via INTRAVENOUS
  Administered 2024-04-02 (×2): .5 mg via INTRAVENOUS

## 2024-04-02 MED ORDER — HEPARIN (PORCINE) 25000 UT/250ML-% IV SOLN
1100.0000 [IU]/h | INTRAVENOUS | Status: DC
  Administered 2024-04-02: 1100 [IU]/h via INTRAVENOUS
  Filled 2024-04-02: qty 250

## 2024-04-02 MED ORDER — POLYETHYLENE GLYCOL 3350 17 G PO PACK: 17.0000 g | PACK | Freq: Every day | ORAL | Status: DC | PRN

## 2024-04-02 MED ORDER — HEPARIN (PORCINE) 25000 UT/250ML-% IV SOLN
1350.0000 [IU]/h | INTRAVENOUS | Status: DC
  Administered 2024-04-03: 1350 [IU]/h via INTRAVENOUS
  Filled 2024-04-02: qty 250

## 2024-04-02 MED ORDER — FENTANYL CITRATE (PF) 100 MCG/2ML IJ SOLN
INTRAMUSCULAR | Status: AC
  Filled 2024-04-02: qty 2

## 2024-04-02 MED ORDER — DOCUSATE SODIUM 100 MG PO CAPS: 100.0000 mg | ORAL_CAPSULE | Freq: Two times a day (BID) | ORAL | Status: DC | PRN

## 2024-04-02 MED ORDER — HEPARIN SODIUM (PORCINE) 1000 UNIT/ML IJ SOLN
INTRAMUSCULAR | Status: AC | PRN
  Administered 2024-04-02: 8000 [IU] via INTRAVENOUS
  Administered 2024-04-02: 2000 [IU] via INTRAVENOUS

## 2024-04-02 MED ORDER — IOHEXOL 300 MG/ML  SOLN
150.0000 mL | Freq: Once | INTRAMUSCULAR | Status: AC | PRN
  Administered 2024-04-02: 80 mL via INTRAVENOUS

## 2024-04-02 MED ORDER — OXIDIZED CELLULOSE EX PADS
1.0000 | MEDICATED_PAD | Freq: Four times a day (QID) | CUTANEOUS | Status: DC | PRN
  Filled 2024-04-02 (×2): qty 1

## 2024-04-02 MED ORDER — LIDOCAINE HCL 1 % IJ SOLN
INTRAMUSCULAR | Status: AC
  Filled 2024-04-02: qty 20

## 2024-04-02 MED ORDER — HEPARIN BOLUS VIA INFUSION
4000.0000 [IU] | Freq: Once | INTRAVENOUS | Status: AC
  Administered 2024-04-02: 4000 [IU] via INTRAVENOUS
  Filled 2024-04-02: qty 4000

## 2024-04-02 MED ORDER — SODIUM CHLORIDE 0.9 % IV BOLUS
1000.0000 mL | Freq: Once | INTRAVENOUS | Status: AC
  Administered 2024-04-02: 1000 mL via INTRAVENOUS

## 2024-04-02 NOTE — H&P (Addendum)
 NAME:  Jo Simpson, MRN:  [id number], DOB:  1946-07-04, LOS: 0 ADMISSION DATE:  04/02/2024, CONSULTATION DATE: 04/02/2024 REFERRING MD:  Trish Furl, MD , CHIEF COMPLAINT: Shortness of breath  History of Present Illness:  78 year old female with osteoporosis was brought to the emergency department with the complaint of increasing shortness of breath for last 2 weeks and today it got worse while walking dog so she came to the emergency department for evaluation.  She was noted to be hypoxic with O2 sat in 80s on room air, she was placed on 3 L nasal cannula oxygen, currently satting high 90s also she was noted to be tachycardic in 130s.  CT angiogram chest was done which showed bilateral acute submassive PE with right heart strain, associated with pulmonary infarct. She was started on IV heparin infusion, PCCM was consulted for help evaluation medical management  Pertinent  Medical History   Past Medical History:  Diagnosis Date   Osteoporosis      Significant Hospital Events: Including procedures, antibiotic start and stop dates in addition to other pertinent events     Interim History / Subjective:  As above  Objective   Blood pressure 126/79, pulse (!) 113, temperature 99 F (37.2 C), temperature source Rectal, resp. rate (!) 22, height 5\' 2"  (1.575 m), weight 78.7 kg, SpO2 92%.       No intake or output data in the 24 hours ending 04/02/24 1308 Filed Weights   04/02/24 0806  Weight: 78.7 kg    Examination: General: Elderly female, lying on the bed HEENT: [place]/AT, eyes anicteric.  moist mucus membranes Neuro: Alert, awake following commands Chest: Coarse breath sounds, no wheezes or rhonchi Heart: Tachycardic, regular rhythm, no murmurs or gallops Abdomen: Soft, nontender, nondistended, bowel sounds present Skin: No rash  Labs and images reviewed Resolved Hospital Problem list     Assessment & Plan:  Acute bilateral submassive PE with right heart strain (cor  pulmonale) Acute respiratory failure with hypoxia Pulmonary infarct Hyperglycemia  Continue IV heparin infusion Her proBNP is elevated, she is requiring 3 L nasal cannula oxygen, she is tachycardic Serum troponin and lactate ordered I did bedside echocardiogram showing RV dilatation, positive for McConnell sign Official echocardiogram is ordered as well as bilateral duplex ultrasound of lower extremities EKG showing sinus tachycardia IR is consulted for possible catheter directed intervention Continue nasal cannula oxygen with O2 sat goal 92% Patient blood sugar is 225, with no known history of diabetes, continue sliding scale insulin Send A1c  Best Practice (right click and "Reselect all SmartList Selections" daily)   Diet/type: NPO DVT prophylaxis systemic heparin Pressure ulcer(s): N/A GI prophylaxis: N/A Lines: N/A Foley:  N/A Code Status:  full code Last date of multidisciplinary goals of care discussion [5/4: Patient was updated at bedside]  Labs   CBC: Recent Labs  Lab 04/02/24 0811  WBC 12.4*  NEUTROABS 10.1*  HGB 15.5*  HCT 47.5*  MCV 95.0  PLT 186    Basic Metabolic Panel: Recent Labs  Lab 04/02/24 0811  NA 139  K 4.0  CL 104  CO2 21*  GLUCOSE 225*  BUN 15  CREATININE 1.17*  CALCIUM 9.3   GFR: Estimated Creatinine Clearance: 37.9 mL/min (A) (by C-G formula based on SCr of 1.17 mg/dL (H)). Recent Labs  Lab 04/02/24 0811  WBC 12.4*    Liver Function Tests: No results for input(s): "AST", "ALT", "ALKPHOS", "BILITOT", "PROT", "ALBUMIN" in the last 168 hours. No results for input(s): "LIPASE", "AMYLASE"  in the last 168 hours. No results for input(s): "AMMONIA" in the last 168 hours.  ABG No results found for: "PHART", "PCO2ART", "PO2ART", "HCO3", "TCO2", "ACIDBASEDEF", "O2SAT"   Coagulation Profile: No results for input(s): "INR", "PROTIME" in the last 168 hours.  Cardiac Enzymes: No results for input(s): "CKTOTAL", "CKMB", "CKMBINDEX",  "TROPONINI" in the last 168 hours.  HbA1C: No results found for: "HGBA1C"  CBG: No results for input(s): "GLUCAP" in the last 168 hours.  Review of Systems:   12 point review of system is significant for complaint mentioned HPI, rest is negative  Past Medical History:  She,  has a past medical history of Osteoporosis.   Surgical History:   Past Surgical History:  Procedure Laterality Date   tonsilittis       Social History:   reports that she has never smoked. She does not have any smokeless tobacco history on file. She reports that she does not drink alcohol and does not use drugs.   Family History:  Her family history includes Heart disease in her father.   Allergies Allergies  Allergen Reactions   Sulfa Antibiotics      Home Medications  Prior to Admission medications   Medication Sig Start Date End Date Taking? Authorizing Provider  doxycycline  (VIBRA -TABS) 100 MG tablet Take 1 tablet (100 mg total) by mouth 2 (two) times daily. 10/29/13   Johnnie Nan, MD  ibuprofen (ADVIL,MOTRIN) 800 MG tablet Take 800 mg by mouth every 8 (eight) hours as needed for pain.    [provider]  naproxen  (NAPROSYN ) 500 MG tablet 1 tab po bid pc x 7 days then prn neck or back pain 08/19/13   Gideons Lalama, Deanna M, PA-C     Critical care time:     The patient is critically ill due to acute submassive PE with cor pulmonale/acute respiratory failure with hypoxia.  Critical care was necessary to treat or prevent imminent or life-threatening deterioration.  Critical care was time spent personally by me on the following activities: development of treatment plan with patient and/or surrogate as well as nursing, discussions with consultants, evaluation of patient's response to treatment, examination of patient, obtaining history from patient or surrogate, ordering and performing treatments and interventions, ordering and review of laboratory studies, ordering and review of  radiographic studies, pulse oximetry, re-evaluation of patient's condition and participation in multidisciplinary rounds.   During this encounter critical care time was devoted to patient care services described in this note for 39 minutes.     Trevor Fudge, MD [place] [facility] See Amion for pager If no response to pager, please call [phone] until 7pm After 7pm, Please call E-link [phone]

## 2024-04-02 NOTE — Progress Notes (Signed)
 PHARMACY - ANTICOAGULATION CONSULT NOTE  Pharmacy Consult for heparin  Indication: pulmonary embolus  Allergies  Allergen Reactions   Sulfa Antibiotics     Patient Measurements: Height: 5\' 2"  (157.5 cm) Weight: 78.7 kg (173 lb 6.4 oz) IBW/kg (Calculated) : 50.1 HEPARIN DW (KG): 67.4  Vital Signs: Temp: 99 F (37.2 C) (05/04 1945) Temp Source: Oral (05/04 1945) BP: 138/85 (05/04 1905) Pulse Rate: 101 (05/04 1905)  Labs: Recent Labs    04/02/24 0811 04/02/24 1410 04/02/24 1952  HGB 15.5*  --   --   HCT 47.5*  --   --   PLT 186  --   --   HEPARINUNFRC  --   --  >1.10*  CREATININE 1.17*  --   --   TROPONINIHS  --  1,516*  --     Estimated Creatinine Clearance: 37.9 mL/min (A) (by C-G formula based on SCr of 1.17 mg/dL (H)).   Medical History: Past Medical History:  Diagnosis Date   Osteoporosis    Assessment: Patient admitted with CC of worsening SOB. Not on anticoagulation PTA, HgB 15.5 and PLTs 186. CTA showing PE. Pharmacy consulted to dose heparin gtt.   Heparin level: >1.10 s/p Pulmonary artery thrombectomy where she received 10K unit bolus during the procedure. RN at beside holding pressure on the unsuccessful groin access site that is currently oozing blood. The procedure was completed using an internal jugular approach. No overt bleeding observed from any other sites.   Goal of Therapy:  Heparin level 0.3-0.7 units/ml Monitor platelets by anticoagulation protocol: Yes   Plan:  Hold for 1 hour Resume heparin infusion at 1100 units/hr Check anti-Xa level in 8 hours and daily while on heparin Continue to monitor H&H and platelets  Mohammed Andrew, PharmD Clinical Pharmacist 04/02/2024 8:41 PM Please check AMION for all [facility] Pharmacy numbers

## 2024-04-02 NOTE — Progress Notes (Addendum)
 eLink Physician-Brief Progress Note Patient Name: Jo Simpson DOB: 1946-08-17 MRN: [id number]   Date of Service  04/02/2024  HPI/Events of Note  78 year old female with a history of osteoporosis brought to the emergency department with progressive dyspnea found to have submassive pulmonary embolus status post IR guided thrombectomy.  Postprocedure she has had some runs of VT and flutter.  Vital signs are within normal limits.  Saturating 100% on 4 L of oxygen.  Results consistent with hyperglycemia, elevated creatinine, BNP/troponin elevation, mild lactic acid elevation and leukocytosis.  EKG with right bundle branch block  eICU Interventions  Maintain systemic anticoagulation  Lower extremity ultrasounds, echocardiogram  DVT prophylaxis with therapeutic heparin GI prophylaxis not indicated   2100 -added consistent carb diet  2353 -surgicel right femoral site bleeding  Intervention Category Evaluation Type: New Patient Evaluation  Jo Simpson 04/02/2024, 7:50 PM

## 2024-04-02 NOTE — ED Notes (Signed)
 Pt transported to IR with this RN.

## 2024-04-02 NOTE — Procedures (Addendum)
 Interventional Radiology Procedure Note  Procedure: Pulmonary artery thrombectomy  Findings: Please refer to procedural dictation for full description. Right common femoral vein 24 Fr access unsuccessful with maintaining access to the pulmonary arteries due to tortuosity.  Conversion to right internal jugular vein 24 Fr access with successful bilateral pulmonary thrombectomy.      Right groin closed with 2 proglides. Right neck closed with 3-0 vicryl pursestring suture (will dissolve, no need for removal unless uncomfortable).  Complications: None immediate  Estimated Blood Loss: 100 mL  Recommendations: Continue anticoagulation. Formal echocardiogram. Bilateral lower extremity venous duplex. IR will follow.   Creasie Doctor, MD Pager: [phone]

## 2024-04-02 NOTE — ED Notes (Signed)
 Patient transported to X-ray

## 2024-04-02 NOTE — Sedation Documentation (Signed)
 Repeat ACT 205

## 2024-04-02 NOTE — Progress Notes (Signed)
 PHARMACY - ANTICOAGULATION CONSULT NOTE  Pharmacy Consult for heparin  Indication: pulmonary embolus  Allergies  Allergen Reactions   Sulfa Antibiotics     Patient Measurements: Height: 5\' 2"  (157.5 cm) Weight: 78.7 kg (173 lb 6.4 oz) IBW/kg (Calculated) : 50.1 HEPARIN DW (KG): 67.4  Vital Signs: Temp: 99 F (37.2 C) (05/04 0820) Temp Source: Rectal (05/04 0820) BP: 110/62 (05/04 1030) Pulse Rate: 120 (05/04 1030)  Labs: Recent Labs    04/02/24 0811  HGB 15.5*  HCT 47.5*  PLT 186  CREATININE 1.17*    Estimated Creatinine Clearance: 37.9 mL/min (A) (by C-G formula based on SCr of 1.17 mg/dL (H)).   Medical History: Past Medical History:  Diagnosis Date   Osteoporosis    Assessment: Patient admitted with CC of worsening SOB. Not on anticoagulation PTA, HgB 15.5 and PLTs 186. CTA showing PE. Pharmacy consulted to dose heparin gtt.   Goal of Therapy:  Heparin level 0.3-0.7 units/ml Monitor platelets by anticoagulation protocol: Yes   Plan:  Give 4000 units bolus x 1 Start heparin infusion at 1100 units/hr Check anti-Xa level in 8 hours and daily while on heparin Continue to monitor H&H and platelets  Mamie Searles, PharmD, BCCCP  04/02/2024,10:56 AM

## 2024-04-02 NOTE — Sedation Documentation (Signed)
 ACT 153 heparin drip no pt,ptt,INR draw

## 2024-04-02 NOTE — ED Provider Notes (Signed)
 I provided a substantive portion of the care of this patient.  I personally made/approved the management plan for this patient and take responsibility for the patient management.  EKG Interpretation Date/Time:  Sunday Apr 02 2024 08:09:15 EDT Ventricular Rate:  132 PR Interval:  40 QRS Duration:  98 QT Interval:  343 QTC Calculation: 509 R Axis:   -83  Text Interpretation: Sinus tachycardia vs atrial flutter Paired ventricular premature complexes Consider right atrial enlargement Left anterior fascicular block Abnormal R-wave progression, late transition Repolarization abnormality, prob rate related Prolonged QT interval Confirmed by Trish Furl [phone]) on 04/02/2024 8:23:52 AM  Patient presented to ED with complaints of shortness of breath.  Patient noted to be tachycardic on arrival.  Concern for possible PE.  CT angio does show submassive PE with heart strain.  Patient does remain tachycardic.  Will add on troponins.  Patient will require admission to the hospital will consult with pulmonary critical care, consideration of thrombolytic therapy, vascular intervention.     Trish Furl, MD 04/02/24 1330

## 2024-04-02 NOTE — ED Notes (Signed)
 Patient transported to CT

## 2024-04-02 NOTE — ED Notes (Signed)
 Pt currently on 3L of O2 for comfort

## 2024-04-02 NOTE — ED Provider Notes (Signed)
 [place] EMERGENCY DEPARTMENT AT [facility] Provider Note   CSN: [id number] Arrival date & time: 04/02/24  0800     History  Chief Complaint  Patient presents with   Shortness of Breath    Jo Simpson is a 78 y.o. female presents today for increased shortness of breath over the last 2 weeks.  Patient states that it was worse this morning when she woke up and also with exertion.  Patient denies cough.  Patient satting in the room at 88% on room air.  No history of blood clot or cardiac issues.  Patient did initially have expiratory wheezing in the upper lobes with EMS, however a nebulizer treatment cleared this wheezing.  EMS also stated that the patient's heart rate was in the 130s going in and out of trigeminy with PACs.  Patient denies fever, chills, nausea, vomiting, chest pain, leg swelling, urinary issues, or any other complaints at this time.   Shortness of Breath      Home Medications Prior to Admission medications   Medication Sig Start Date End Date Taking? Authorizing Provider  doxycycline  (VIBRA -TABS) 100 MG tablet Take 1 tablet (100 mg total) by mouth 2 (two) times daily. 10/29/13   Johnnie Nan, MD  ibuprofen (ADVIL,MOTRIN) 800 MG tablet Take 800 mg by mouth every 8 (eight) hours as needed for pain.    [provider]  naproxen  (NAPROSYN ) 500 MG tablet 1 tab po bid pc x 7 days then prn neck or back pain 08/19/13   Gideons Lalama, Deanna M, PA-C      Allergies    Sulfa antibiotics    Review of Systems   Review of Systems  Respiratory:  Positive for shortness of breath.     Physical Exam Updated Vital Signs BP 126/79   Pulse (!) 113   Temp 99 F (37.2 C) (Rectal)   Resp (!) 22   Ht 5\' 2"  (1.575 m)   Wt 78.7 kg   SpO2 92%   BMI 31.72 kg/m  Physical Exam Vitals and nursing note reviewed.  Constitutional:      General: She is not in acute distress.    Appearance: She is well-developed. She is ill-appearing.  HENT:      Head: Normocephalic and atraumatic.     Mouth/Throat:     Mouth: Mucous membranes are moist.  Eyes:     Extraocular Movements: Extraocular movements intact.     Conjunctiva/sclera: Conjunctivae normal.  Cardiovascular:     Rate and Rhythm: Tachycardia present. Rhythm irregular.     Pulses: Normal pulses.     Heart sounds: No murmur heard. Pulmonary:     Effort: Tachypnea and respiratory distress present.     Breath sounds: Normal breath sounds.     Comments: Coarse breath sounds in left upper lobe Chest:     Chest wall: No tenderness.  Abdominal:     Palpations: Abdomen is soft.     Tenderness: There is no abdominal tenderness.  Musculoskeletal:        General: No swelling.     Cervical back: Neck supple.     Right lower leg: No edema.     Left lower leg: No edema.  Skin:    General: Skin is warm.     Capillary Refill: Capillary refill takes less than 2 seconds.  Neurological:     General: No focal deficit present.     Mental Status: She is alert.  Psychiatric:  Mood and Affect: Mood normal.     ED Results / Procedures / Treatments   Labs (all labs ordered are listed, but only abnormal results are displayed) Labs Reviewed  BASIC METABOLIC PANEL WITH GFR - Abnormal; Notable for the following components:      Result Value   CO2 21 (*)    Glucose, Bld 225 (*)    Creatinine, Ser 1.17 (*)    GFR, Estimated 47 (*)    All other components within normal limits  CBC WITH DIFFERENTIAL/PLATELET - Abnormal; Notable for the following components:   WBC 12.4 (*)    Hemoglobin 15.5 (*)    HCT 47.5 (*)    Neutro Abs 10.1 (*)    Abs Immature Granulocytes 0.08 (*)    All other components within normal limits  BRAIN NATRIURETIC PEPTIDE - Abnormal; Notable for the following components:   B Natriuretic Peptide 118.0 (*)    All other components within normal limits  D-DIMER, QUANTITATIVE - Abnormal; Notable for the following components:   D-Dimer, Quant 8.66 (*)    All other  components within normal limits  MRSA NEXT GEN BY PCR, NASAL  HEPARIN LEVEL (UNFRACTIONATED)  LACTIC ACID, PLASMA  HEMOGLOBIN A1C  TROPONIN I (HIGH SENSITIVITY)    EKG EKG Interpretation Date/Time:  Sunday Apr 02 2024 08:09:15 EDT Ventricular Rate:  132 PR Interval:  40 QRS Duration:  98 QT Interval:  343 QTC Calculation: 509 R Axis:   -83  Text Interpretation: Sinus tachycardia vs atrial flutter Paired ventricular premature complexes Consider right atrial enlargement Left anterior fascicular block Abnormal R-wave progression, late transition Repolarization abnormality, prob rate related Prolonged QT interval Confirmed by Trish Furl [phone]) on 04/02/2024 8:23:52 AM  Radiology CT Angio Chest Pulmonary Embolism (PE) W or WO Contrast Result Date: 04/02/2024 CLINICAL DATA:  Evaluate for pulmonary embolism. Positive D-dimer. Shortness of breath. EXAM: CT ANGIOGRAPHY CHEST WITH CONTRAST TECHNIQUE: Multidetector CT imaging of the chest was performed using the standard protocol during bolus administration of intravenous contrast. Multiplanar CT image reconstructions and MIPs were obtained to evaluate the vascular anatomy. RADIATION DOSE REDUCTION: This exam was performed according to the departmental dose-optimization program which includes automated exposure control, adjustment of the mA and/or kV according to patient size and/or use of iterative reconstruction technique. CONTRAST:  75mL OMNIPAQUE IOHEXOL 350 MG/ML SOLN COMPARISON:  None Available. FINDINGS: Cardiovascular: There is satisfactory opacification of the pulmonary arteries to the segmental level. Large clot burden is identified within the right main pulmonary artery which extends into the right lower lobe, right upper lobe pulmonary arteries. There is also multiple filling defects within the proximal left upper lobar and segmental pulmonary arteries as well as the left lower lobe segmental pulmonary arteries. Signs of right heart strain  identified with RV to LV ratio of 1.46. Mild cardiac enlargement. Aortic atherosclerosis and coronary artery calcifications. Mediastinum/Nodes: Thyroid  gland, trachea, and esophagus appear normal. Small hiatal hernia identified. No mediastinal or hilar adenopathy. Lungs/Pleura: No pleural effusion. Geographic area of ground-glass attenuation and mild consolidative change noted involving the superior segment of the left lower lobe, image 32/6. Mild pleural thickening and ground-glass attenuation is noted overlying the posteromedial right lower lobe, image 48/6. Upper Abdomen: No acute abnormality. Small calcifications noted within the upper pole of the left kidney, image 129/5. Gallstones. Musculoskeletal: No chest wall abnormality. No acute or significant osseous findings. Review of the MIP images confirms the above findings. IMPRESSION: 1. Examination is positive for acute bilateral pulmonary emboli with large  clot burden within the right main pulmonary artery which extends into the right lower lobe, an right upper lobe pulmonary arteries. There is also multiple filling defects within the proximal left upper lobar and segmental pulmonary arteries as well as the left lower lobe segmental pulmonary arteries. 2. Signs of right heart strain identified with RV to LV ratio of 1.46. 3. Geographic area of ground-glass attenuation and mild consolidative change noted involving the superior segment of the left lower lobe. This is favored to represent pulmonary infarct. 4. Mild pleural thickening and ground-glass attenuation is noted overlying the posteromedial right lower lobe. This is favored to represent atelectasis. 5. Gallstones. 6. Left nephrolithiasis. 7.  Aortic Atherosclerosis (ICD10-I70.0). Critical Value/emergent results were called by telephone at the time of interpretation on 04/02/2024 at 10:57 am to provider Barbarita Hutmacher , who verbally acknowledged these results. Electronically Signed   By: Kimberley Penman M.D.    On: 04/02/2024 10:58   DG Chest 2 View Result Date: 04/02/2024 CLINICAL DATA:  Shortness of breath. EXAM: CHEST - 2 VIEW COMPARISON:  None Available. FINDINGS: The heart size and mediastinal contours are within normal limits. Both lungs are clear. Probable old fracture deformity of distal right clavicle noted. IMPRESSION: No active cardiopulmonary disease. Electronically Signed   By: Marlyce Sine M.D.   On: 04/02/2024 08:57    Procedures Procedures    Medications Ordered in ED Medications  heparin ADULT infusion 100 units/mL (25000 units/250mL) (1,100 Units/hr Intravenous New Bag/Given 04/02/24 1128)  docusate sodium (COLACE) capsule 100 mg (has no administration in time range)  polyethylene glycol (MIRALAX / GLYCOLAX) packet 17 g (has no administration in time range)  insulin aspart (novoLOG) injection 0-9 Units (has no administration in time range)  sodium chloride 0.9 % bolus 1,000 mL (0 mLs Intravenous Stopped 04/02/24 1155)  iohexol (OMNIPAQUE) 350 MG/ML injection 75 mL (75 mLs Intravenous Contrast Given 04/02/24 1044)  heparin bolus via infusion 4,000 Units (4,000 Units Intravenous Bolus from Bag 04/02/24 1128)    ED Course/ Medical Decision Making/ A&P                                 Medical Decision Making Amount and/or Complexity of Data Reviewed Labs: ordered. Radiology: ordered.  Risk Prescription drug management. Decision regarding hospitalization.   This patient presents to the ED for concern of [facility], this involves an extensive number of treatment options, and is a complaint that carries with it a high risk of complications and morbidity.  The differential diagnosis includes PE, SVT, A-fib, anemia, pneumothorax, COPD, asthma, CHF     Lab Tests:  I Ordered, and personally interpreted labs.  The pertinent results include: Elevated hemoglobin at 15.5, leukocytosis of 12.4, D-dimer 8.66, elevated blood glucose at 225, mildly elevated creatinine 1.17,  BNP 118   Imaging  Studies ordered:  I ordered imaging studies including chest x-ray I independently visualized and interpreted imaging which showed no active cardiopulmonary disease I agree with the radiologist interpretation CT angio PE: sub-massive PE with right heart strain   Cardiac Monitoring: / EKG:  The patient was maintained on a cardiac monitor.  I personally viewed and interpreted the cardiac monitored which showed an underlying rhythm of: Sinus tach versus a flutter   Problem List / ED Course / Critical interventions / Medication management  I ordered medication including Heparin for PE  Reevaluation of the patient after these medicines showed that the patient stayed  the same I have reviewed the patients home medicines and have made adjustments as needed   Consultations Obtained:  I requested consultation with the Intensivist,  And discussed lab and imaging findings as well as pertinent plan - they recommend: they consulted IR and pt is agreeable to thrombectomy   Test / Admission - Considered:  Admit         Final Clinical Impression(s) / ED Diagnoses Final diagnoses:  Acute pulmonary embolism with acute cor pulmonale, unspecified pulmonary embolism type [facility])    Rx / DC Orders ED Discharge Orders     None         Carie Charity, PA-C 04/02/24 1328    Trish Furl, MD 04/03/24 [phone]

## 2024-04-02 NOTE — ED Triage Notes (Signed)
 Pt presents to ED via EMS from home with increased SOB over the past two weeks. States that it was a lot worse this morning when she woke up and noticed it worse when she was outside walking her dog. No cough. 3L [place] placed by EMS as oxygen sats decreased to 90 on room air. Expiratory wheezing in upper lobes. No chest pain or hx of cardiac issues. EMS states HR in 130s going in and out of trigeminy with PACs.  5mg  albuterol nebulizer given.

## 2024-04-02 NOTE — Consult Note (Signed)
 Chief Complaint: Acute pulmonary embolism  Referring Physician(s): Dr. Trevor Fudge   Supervising Physician: Creasie Doctor  Patient Status: [facility] - ED  History of Present Illness: Jo Simpson is a 78 y.o. female with a past medical history significant for osteoporosis. She presented to the ED today with complaints of worsening shortness of breath which started approximately 2 weeks ago. She was walking her dog today and her shortness of breath became worse while walking. In the ED she was noted to be hypoxic with O2 sats in 80s on room air. She was also tachycardic in the 130s. CTA chest showed bilateral pulmonary emboli with right heart strain and pulmonary infarct. BNP 118 and D-Dimer 8.66. She was started on an IV heparin infusion and PCCM was consulted for management. Dr. Zaida Hertz performed a bedside echocardiogram showing RV dilatation.   CTA PE 04/02/24 IMPRESSION: 1. Examination is positive for acute bilateral pulmonary emboli with large clot burden within the right main pulmonary artery which extends into the right lower lobe, an right upper lobe pulmonary arteries. There is also multiple filling defects within the proximal left upper lobar and segmental pulmonary arteries as well as the left lower lobe segmental pulmonary arteries. 2. Signs of right heart strain identified with RV to LV ratio of 1.46. 3. Geographic area of ground-glass attenuation and mild consolidative change noted involving the superior segment of the left lower lobe. This is favored to represent pulmonary infarct. 4. Mild pleural thickening and ground-glass attenuation is noted overlying the posteromedial right lower lobe. This is favored to represent atelectasis. 5. Gallstones. 6. Left nephrolithiasis. 7.  Aortic Atherosclerosis (ICD10-I70.0).  The patient has been admitted for further treatment. An echocardiogram and lower extremity duplex study has been ordered. Interventional Radiology has been  consulted and asked to evaluate this patient for an image-guided catheter-directed pulmonary artery thrombectomy. Imaging and clinical information reviewed and procedure approved by Dr. Jinx Mourning.    History of prior venous thromboembolism? No Currently taking any anticoagulation? No Any recent surgery or immobilization? No Any recent illness? No Recent travel with prolonged sitting? No Taking oral contraceptives/hormone supplementation? No Smoking history? Remote. Brief history of smoking in the '70s.   Past Medical History:  Diagnosis Date   Osteoporosis     Past Surgical History:  Procedure Laterality Date   tonsilittis     Allergies: Sulfa antibiotics  Medications: Prior to Admission medications   Medication Sig Start Date End Date Taking? Authorizing Provider  doxycycline  (VIBRA -TABS) 100 MG tablet Take 1 tablet (100 mg total) by mouth 2 (two) times daily. 10/29/13   Johnnie Nan, MD  ibuprofen (ADVIL,MOTRIN) 800 MG tablet Take 800 mg by mouth every 8 (eight) hours as needed for pain.    [provider]  naproxen  (NAPROSYN ) 500 MG tablet 1 tab po bid pc x 7 days then prn neck or back pain 08/19/13   Gideons Lalama, Deanna M, PA-C     Family History  Problem Relation Age of Onset   Heart disease Father     Social History   Socioeconomic History   Marital status: Married    Spouse name: Not on file   Number of children: Not on file   Years of education: Not on file   Highest education level: Not on file  Occupational History   Not on file  Tobacco Use   Smoking status: Never   Smokeless tobacco: Not on file  Substance and Sexual Activity   Alcohol use: No  Drug use: No   Sexual activity: Never  Other Topics Concern   Not on file  Social History Narrative   Not on file   Social Drivers of Health   Financial Resource Strain: Not on file  Food Insecurity: Not on file  Transportation Needs: Not on file  Physical Activity: Not on file   Stress: Not on file  Social Connections: Not on file     Review of Systems: A 12 point ROS discussed and pertinent positives are indicated in the HPI above.  All other systems are negative.  Review of Systems  Respiratory:  Positive for shortness of breath.   All other systems reviewed and are negative.  Vital Signs: BP 126/79   Pulse (!) 113   Temp 99 F (37.2 C) (Rectal)   Resp (!) 22   Ht 5\' 2"  (1.575 m)   Wt 173 lb 6.4 oz (78.7 kg)   SpO2 92%   BMI 31.72 kg/m    Physical Exam Constitutional:      General: She is not in acute distress.    Appearance: She is not ill-appearing.  HENT:     Mouth/Throat:     Mouth: Mucous membranes are moist.     Pharynx: Oropharynx is clear.  Cardiovascular:     Rate and Rhythm: Tachycardia present.  Pulmonary:     Effort: Pulmonary effort is normal.  Abdominal:     Palpations: Abdomen is soft.     Tenderness: There is no abdominal tenderness.  Musculoskeletal:     Right lower leg: No edema.     Left lower leg: No edema.  Skin:    General: Skin is warm and dry.  Neurological:     Mental Status: She is alert and oriented to person, place, and time.    Imaging: CTA Chest: 04/02/24  Thrombus distribution: Bilateral with large clot burden in the right main pulmonary artery.  RV:LV = 1.46  Echocardiogram: n/a  Bilateral lower extremity venous duplex: n/a  Labs:  BNP: 118 Troponin: n/a  CBC: Recent Labs    04/02/24 0811  WBC 12.4*  HGB 15.5*  HCT 47.5*  PLT 186    COAGS: No results for input(s): "INR", "APTT" in the last 8760 hours.  BMP: Recent Labs    04/02/24 0811  NA 139  K 4.0  CL 104  CO2 21*  GLUCOSE 225*  BUN 15  CALCIUM 9.3  CREATININE 1.17*  GFRNONAA 47*    LIVER FUNCTION TESTS: No results for input(s): "BILITOT", "AST", "ALT", "ALKPHOS", "PROT", "ALBUMIN" in the last 8760 hours.   PESI Score ([employer].es): 119, Class IV, High  Risk   Assessment and Plan: CHARMANE MENNEN is a 78 y.o. female with a history of two weeks of progressive shortness of breath presenting with [employer] Intermediate High Risk acute, unprovoked pulmonary embolism.    We discussed her image findings and treatment options including thrombectomy and systemic anticoagulation. She expressed a desire for thrombectomy as this is the treatment option with the most immediate results.   Risks and benefits of PE thrombectomy discussed with the patient including, but not limited to bleeding, pulmonary hemorrhage, perforation or dissection of cardiovascular structures, vascular injury, stroke, contrast induced renal failure, and infection.  She has been NPO. Consent has been signed and is in the IR Control room.   Electronically Signed: Jareli Highland R Wylan Gentzler, NP 04/02/2024, 1:00 PM   I spent a total of 20 Minutes in face to face in clinical  consultation, greater than 50% of which was counseling/coordinating care for pulmonary embolism.

## 2024-04-03 ENCOUNTER — Telehealth (HOSPITAL_COMMUNITY): Payer: Self-pay | Admitting: Pharmacy Technician

## 2024-04-03 ENCOUNTER — Other Ambulatory Visit (HOSPITAL_COMMUNITY)

## 2024-04-03 ENCOUNTER — Inpatient Hospital Stay (HOSPITAL_COMMUNITY)

## 2024-04-03 ENCOUNTER — Other Ambulatory Visit (HOSPITAL_COMMUNITY): Payer: Self-pay

## 2024-04-03 ENCOUNTER — Telehealth: Payer: Self-pay | Admitting: Internal Medicine

## 2024-04-03 DIAGNOSIS — I82441 Acute embolism and thrombosis of right tibial vein: Secondary | ICD-10-CM

## 2024-04-03 DIAGNOSIS — I82431 Acute embolism and thrombosis of right popliteal vein: Secondary | ICD-10-CM

## 2024-04-03 DIAGNOSIS — I2699 Other pulmonary embolism without acute cor pulmonale: Secondary | ICD-10-CM

## 2024-04-03 DIAGNOSIS — I2602 Saddle embolus of pulmonary artery with acute cor pulmonale: Secondary | ICD-10-CM

## 2024-04-03 DIAGNOSIS — I82451 Acute embolism and thrombosis of right peroneal vein: Secondary | ICD-10-CM

## 2024-04-03 DIAGNOSIS — Z86711 Personal history of pulmonary embolism: Secondary | ICD-10-CM

## 2024-04-03 DIAGNOSIS — I82411 Acute embolism and thrombosis of right femoral vein: Secondary | ICD-10-CM

## 2024-04-03 LAB — GLUCOSE, CAPILLARY
Glucose-Capillary: 98 mg/dL (ref 70–99)
Glucose-Capillary: 98 mg/dL (ref 70–99)
Glucose-Capillary: 81 mg/dL (ref 70–99)

## 2024-04-03 LAB — BASIC METABOLIC PANEL WITH GFR
Anion gap: 8 (ref 5–15)
BUN: 14 mg/dL (ref 8–23)
CO2: 19 mmol/L — ABNORMAL LOW (ref 22–32)
Calcium: 8.3 mg/dL — ABNORMAL LOW (ref 8.9–10.3)
Chloride: 112 mmol/L — ABNORMAL HIGH (ref 98–111)
Creatinine, Ser: 0.76 mg/dL (ref 0.44–1.00)
GFR, Estimated: >60 mL/min (ref >60)
Glucose, Bld: 95 mg/dL (ref 70–99)
Potassium: 4 mmol/L (ref 3.5–5.1)
Sodium: 139 mmol/L (ref 135–145)

## 2024-04-03 LAB — CBC
HCT: 41.3 % (ref 36.0–46.0)
Hemoglobin: 13.7 g/dL (ref 12.0–15.0)
MCH: 31.1 pg (ref 26.0–34.0)
MCHC: 33.2 g/dL (ref 30.0–36.0)
MCV: 93.7 fL (ref 80.0–100.0)
Platelets: 194 10*3/uL (ref 150–400)
RBC: 4.41 MIL/uL (ref 3.87–5.11)
RDW: 12.8 % (ref 11.5–15.5)
WBC: 12.8 10*3/uL — ABNORMAL HIGH (ref 4.0–10.5)
nRBC: 0 % (ref 0.0–0.2)

## 2024-04-03 LAB — PHOSPHORUS: Phosphorus: 2.5 mg/dL (ref 2.5–4.6)

## 2024-04-03 LAB — ECHOCARDIOGRAM COMPLETE
Area-P 1/2: 3.83 cm2
Height: 62 in
S' Lateral: 2.9 cm
Weight: 2670.21 [oz_av]

## 2024-04-03 LAB — HEPARIN LEVEL (UNFRACTIONATED): Heparin Unfractionated: 0.16 [IU]/mL — ABNORMAL LOW (ref 0.30–0.70)

## 2024-04-03 LAB — MAGNESIUM: Magnesium: 2.2 mg/dL (ref 1.7–2.4)

## 2024-04-03 MED ORDER — APIXABAN 5 MG PO TABS: 5.0000 mg | ORAL_TABLET | Freq: Two times a day (BID) | ORAL | Status: DC

## 2024-04-03 MED ORDER — APIXABAN 5 MG PO TABS
10.0000 mg | ORAL_TABLET | Freq: Two times a day (BID) | ORAL | Status: DC
  Administered 2024-04-03: 10 mg via ORAL
  Filled 2024-04-03: qty 2

## 2024-04-03 MED ORDER — APIXABAN 5 MG PO TABS
5.0000 mg | ORAL_TABLET | Freq: Two times a day (BID) | ORAL | 3 refills | Status: AC
  Filled 2024-04-03: qty 60, 30d supply, fill #0

## 2024-04-03 MED ORDER — PERFLUTREN LIPID MICROSPHERE
1.0000 mL | INTRAVENOUS | Status: DC | PRN
  Administered 2024-04-03: 3 mL via INTRAVENOUS

## 2024-04-03 MED ORDER — APIXABAN (ELIQUIS) VTE STARTER PACK (10MG AND 5MG)
ORAL_TABLET | ORAL | 0 refills | Status: DC
  Filled 2024-04-03: qty 74, 30d supply, fill #0

## 2024-04-03 NOTE — Progress Notes (Signed)
 NAME:  Jo Simpson, MRN:  [id number], DOB:  01-02-1946, LOS: 1 ADMISSION DATE:  04/02/2024, CONSULTATION DATE: 04/02/2024 REFERRING MD:  Trish Furl, MD , CHIEF COMPLAINT: Shortness of breath  History of Present Illness:  78 year old female with osteoporosis was brought to the emergency department with the complaint of increasing shortness of breath for last 2 weeks and today it got worse while walking dog so she came to the emergency department for evaluation.  She was noted to be hypoxic with O2 sat in 80s on room air, she was placed on 3 L nasal cannula oxygen, currently satting high 90s also she was noted to be tachycardic in 130s.  CT angiogram chest was done which showed bilateral acute submassive PE with right heart strain, associated with pulmonary infarct. She was started on IV heparin infusion, PCCM was consulted for help evaluation medical management  Pertinent  Medical History   Past Medical History:  Diagnosis Date   Osteoporosis      Significant Hospital Events: Including procedures, antibiotic start and stop dates in addition to other pertinent events   5/4: admit to ccm for acute submassive PE with RH strain and pulmonary infarct. IR PA thrombectomy with significant clot burden on the right - see procedural note  5/5: much improved this morning subjectively. On room air. Having some ectopy which started after IR. Continues on heparin. Needs echo, BLE DVT studies   Interim History / Subjective:  NAEON. She feels much better this morning. Denies shortness of breath or chest pain. On room air.   Objective   Blood pressure (!) 160/125, pulse 95, temperature 98.1 F (36.7 C), temperature source Oral, resp. rate 18, height 5\' 2"  (1.575 m), weight 75.7 kg, SpO2 92%.        Intake/Output Summary (Last 24 hours) at 04/03/2024 0754 Last data filed at 04/03/2024 0700 Gross per 24 hour  Intake 260.54 ml  Output 200 ml  Net 60.54 ml   Filed Weights   04/02/24 0806 04/03/24 0500   Weight: 78.7 kg 75.7 kg    Examination: General: Elderly female sitting in bed, NAD HEENT: [place]/AT, eyes anicteric, glass, mmm Neuro: Alert, awake, oriented x3, non focal exam Chest: lungs CTAB, room air Heart: sinus rhythm, no murmur, rub, gallop Abdomen: Soft, nontender, nondistended, +BS Skin: No rash  Resolved Hospital Problem list   Acute respiratory failure with hypoxia  Assessment & Plan:  Acute bilateral submassive PE with right heart strain (cor pulmonale) s/p IR thrombectomy  Pulmonary infarct Hyperglycemia  - con't heparin gtt, will need plan for transition to PO, likely lifelong anticoagulation  - TTE and bilateral lower extremity DVT studies today  - tele monitoring  - diet ordered  - A1c 5.2, she arrived with BG > 200 ? Maybe just stress related. Continue SSI while in hospital for BG goals. Likely stop on discharge  - diet ordered  - out of ICU after imaging if she is stable.   Best Practice (right click and "Reselect all SmartList Selections" daily)   Diet/type: Regular consistency (see orders) DVT prophylaxis systemic heparin Pressure ulcer(s): N/A GI prophylaxis: N/A Lines: N/A Foley:  N/A Code Status:  full code Last date of multidisciplinary goals of care discussion [5/5: patient updated at bedside]   Labs   CBC: Recent Labs  Lab 04/02/24 0811 04/03/24 0445  WBC 12.4* 12.8*  NEUTROABS 10.1*  --   HGB 15.5* 13.7  HCT 47.5* 41.3  MCV 95.0 93.7  PLT 186 194  Basic Metabolic Panel: Recent Labs  Lab 04/02/24 0811 04/03/24 0445  NA 139 139  K 4.0 4.0  CL 104 112*  CO2 21* 19*  GLUCOSE 225* 95  BUN 15 14  CREATININE 1.17* 0.76  CALCIUM 9.3 8.3*  MG  --  2.2  PHOS  --  2.5   GFR: Estimated Creatinine Clearance: 54.3 mL/min (by C-G formula based on SCr of 0.76 mg/dL). Recent Labs  Lab 04/02/24 0811 04/02/24 1410 04/03/24 0445  WBC 12.4*  --  12.8*  LATICACIDVEN  --  2.7*  --     Liver Function Tests: No results for input(s):  "AST", "ALT", "ALKPHOS", "BILITOT", "PROT", "ALBUMIN" in the last 168 hours. No results for input(s): "LIPASE", "AMYLASE" in the last 168 hours. No results for input(s): "AMMONIA" in the last 168 hours.  ABG No results found for: "PHART", "PCO2ART", "PO2ART", "HCO3", "TCO2", "ACIDBASEDEF", "O2SAT"   Coagulation Profile: No results for input(s): "INR", "PROTIME" in the last 168 hours.  Cardiac Enzymes: No results for input(s): "CKTOTAL", "CKMB", "CKMBINDEX", "TROPONINI" in the last 168 hours.  HbA1C: Hgb A1c MFr Bld  Date/Time Value Ref Range Status  04/02/2024 02:10 PM 5.2 4.8 - 5.6 % Final    Comment:    (NOTE) Pre diabetes:          5.7%-6.4%  Diabetes:              >6.4%  Glycemic control for   <7.0% adults with diabetes     CBG: Recent Labs  Lab 04/02/24 1412 04/02/24 1951 04/02/24 2329 04/03/24 0350 04/03/24 0725  GLUCAP 121* 119* 93 98 98    Review of Systems:   As above  Past Medical History:  She,  has a past medical history of Osteoporosis.   Surgical History:   Past Surgical History:  Procedure Laterality Date   IR ANGIOGRAM PULMONARY BILATERAL SELECTIVE  04/02/2024   IR ANGIOGRAM SELECTIVE EACH ADDITIONAL VESSEL  04/02/2024   IR THROMBECT VENO MECH MOD SED  04/02/2024   IR US  GUIDE VASC ACCESS RIGHT  04/02/2024   IR US  GUIDE VASC ACCESS RIGHT  04/02/2024   tonsilittis       Social History:   reports that she has never smoked. She does not have any smokeless tobacco history on file. She reports that she does not drink alcohol and does not use drugs.   Family History:  Her family history includes Heart disease in her father.   Allergies Allergies  Allergen Reactions   Sulfa Antibiotics      Home Medications  Prior to Admission medications   Medication Sig Start Date End Date Taking? Authorizing Provider  doxycycline  (VIBRA -TABS) 100 MG tablet Take 1 tablet (100 mg total) by mouth 2 (two) times daily. 10/29/13   Johnnie Nan, MD  ibuprofen  (ADVIL,MOTRIN) 800 MG tablet Take 800 mg by mouth every 8 (eight) hours as needed for pain.    [provider]  naproxen  (NAPROSYN ) 500 MG tablet 1 tab po bid pc x 7 days then prn neck or back pain 08/19/13   Gideons Lalama, Deanna M, PA-C     Critical care time: 35   Arlys Lamer [facility] 04/03/24 10:30 AM  Please see [email or web address] for pager details.  From 7A-7P if no response, please call [phone] After hours, please call ELink [phone]

## 2024-04-03 NOTE — Discharge Instructions (Addendum)
 Information on my medicine - ELIQUIS (apixaban)  This medication education was reviewed with me or my healthcare representative as part of my discharge preparation.   Why was Eliquis prescribed for you? Eliquis was prescribed to treat blood clots that may have been found in the veins of your legs (deep vein thrombosis) or in your lungs (pulmonary embolism) and to reduce the risk of them occurring again.  What do You need to know about Eliquis ? The starting dose is 10 mg (two 5 mg tablets) taken TWICE daily for the FIRST SEVEN (7) DAYS, then on 04/10/24  the dose is reduced to ONE 5 mg tablet taken TWICE daily.  Eliquis may be taken with or without food.   Try to take the dose about the same time in the morning and in the evening. If you have difficulty swallowing the tablet whole please discuss with your pharmacist how to take the medication safely.  Take Eliquis exactly as prescribed and DO NOT stop taking Eliquis without talking to the doctor who prescribed the medication.  Stopping may increase your risk of developing a new blood clot.  Refill your prescription before you run out.  After discharge, you should have regular check-up appointments with your healthcare provider that is prescribing your Eliquis.    What do you do if you miss a dose? If a dose of ELIQUIS is not taken at the scheduled time, take it as soon as possible on the same day and twice-daily administration should be resumed. The dose should not be doubled to make up for a missed dose.  Important Safety Information A possible side effect of Eliquis is bleeding. You should call your healthcare provider right away if you experience any of the following: Bleeding from an injury or your nose that does not stop. Unusual colored urine (red or dark brown) or unusual colored stools (red or black). Unusual bruising for unknown reasons. A serious fall or if you hit your head (even if there is no bleeding).  Some medicines  may interact with Eliquis and might increase your risk of bleeding or clotting while on Eliquis. To help avoid this, consult your healthcare provider or pharmacist prior to using any new prescription or non-prescription medications, including herbals, vitamins, non-steroidal anti-inflammatory drugs (NSAIDs) and supplements.  This website has more information on Eliquis (apixaban): http://www.eliquis.com/eliquis/home

## 2024-04-03 NOTE — Progress Notes (Signed)
 04/03/2024 Echo w/ [facility] but poor windows (formal read pending) RLE extensive DVT noted Patient ambulatory on RA, BP, HR normal Should be okay for home; history suggests an acute on chronic process. Repeat echo in 3 months should be pursued, will send note to our clinic for f/u.  Ardelle Kos MD PCCM

## 2024-04-03 NOTE — TOC Transition Note (Signed)
 Transition of Care [facility]) - Discharge Note   Patient Details  Name: Jo Simpson MRN: [id number] Date of Birth: 15-Apr-1946  [facility] CM/SW Contact:  Benjiman Bras, RN Phone Number: [phone] 04/03/2024, 2:56 PM   Clinical Narrative:     TOC CM spoke to pt and she is independent at home has family and friends that can assist if needed. Pt states she drives to appts. Discussed follow up appt with her PCP, explained the office will schedule follow up appt.   Faxed dc summary to PCP's office. They call pt first before they arrange appt.   Final next level of care: Home/Self Care Barriers to Discharge: No Barriers Identified   Patient Goals and CMS Choice Patient states their goals for this hospitalization and ongoing recovery are:: wants to remain independent          Discharge Placement                       Discharge Plan and Services Additional resources added to the After Visit Summary for     Discharge Planning Services: CM Consult                                 Social Drivers of Health (SDOH) Interventions SDOH Screenings   Food Insecurity: No Food Insecurity (04/03/2024)  Housing: Low Risk  (04/03/2024)  Transportation Needs: No Transportation Needs (04/03/2024)  Utilities: Not At Risk (04/03/2024)  Social Connections: Socially Isolated (04/03/2024)  Tobacco Use: Unknown (04/02/2024)     Readmission Risk Interventions     No data to display

## 2024-04-03 NOTE — Progress Notes (Incomplete)
    Referring Provider(s): Dr. Trevor Fudge  Supervising Physician: Alyssa Jumper  Patient Status:  [name] - In-pt  Chief Complaint: Acute pulmonary embolism  Brief History:  78 year old female with medical history of osteoporosis. She presented to the ED on 04/02/24 with complaints of worsening shortness of breath which started approximately 2 weeks ago. She was walking her dog when her shortness of breath became worse. In the ED she was noted to be hypoxic with O2 sats in the 80s on room air. She was also tachycardic to the 130s. CTA chest revealed bilateral pulmonary emboli with right heart strain and pulmonary infarct. BNP 118 and D-dimer 8.66. She was started on an IV heparin infusion and PCCM was consulted for management. Dr. Zaida Hertz performed a bedside echocardiogram showing RV dilation. [place] extremity duplex study was ordered. IR consulted and patient underwent bilateral pulmonary thrombectomy with Dr. Harrison Lin on 04/02/24.   Subjective:  ***  Allergies: Sulfa antibiotics  Medications: Prior to Admission medications   Medication Sig Start Date End Date Taking? Authorizing Provider  alendronate (FOSAMAX) 70 MG tablet Take 70 mg by mouth once a week. Patient not taking: Reported on 04/03/2024 03/13/24   [provider]  Multiple Vitamins-Minerals (PRESERVISION AREDS PO) Take 1 tablet by mouth in the morning and at bedtime.   Yes [provider]  OVER THE COUNTER MEDICATION Take 2 tablets by mouth in the morning and at bedtime. Algecal plus   Yes [provider]  VITAMIN D PO Take 1 capsule by mouth daily.   Yes [provider]  ibuprofen (ADVIL) 200 MG tablet Take 200 mg by mouth daily as needed for mild pain (pain score 1-3) or moderate pain (pain score 4-6).   Yes [provider]     Vital Signs: BP (!) 157/85   Pulse 83   Temp 98.2 F (36.8 C) (Oral)   Resp 19   Ht 5\' 2"  (1.575 m)   Wt 166 lb 14.2 oz (75.7 kg)   SpO2 99%   BMI 30.52  kg/m   Physical Exam   Labs:  CBC: Recent Labs    04/02/24 0811 04/03/24 0445  WBC 12.4* 12.8*  HGB 15.5* 13.7  HCT 47.5* 41.3  PLT 186 194    COAGS: No results for input(s): "INR", "APTT" in the last 8760 hours.  BMP: Recent Labs    04/02/24 0811 04/03/24 0445  NA 139 139  K 4.0 4.0  CL 104 112*  CO2 21* 19*  GLUCOSE 225* 95  BUN 15 14  CALCIUM 9.3 8.3*  CREATININE 1.17* 0.76  GFRNONAA 47* >60    LIVER FUNCTION TESTS: No results for input(s): "BILITOT", "AST", "ALT", "ALKPHOS", "PROT", "ALBUMIN" in the last 8760 hours.  Assessment and Plan:  Bilateral pulmonary emboli s/p thrombectomy: NICHA Jo Simpson is a 78 y.o. female with a history of bilateral pulmonary emboli who presented to [facility] Interventional Radiology department for an image-guided pulmonary artery thrombectomy with Dr. Harrison Lin on 04/02/24.   -***  Thank you for allowing our service to participate in [name] 's care.   Electronically Signed: Orson Blalock, PA-C 04/03/2024, 11:28 AM    I spent a total of {Evaluation Minutes:[id number]} at the the patient's bedside AND on the patient's hospital floor or unit, greater than 50% of which was counseling/coordinating care for PE thrombectomy follow up.

## 2024-04-03 NOTE — Discharge Summary (Signed)
 Physician Discharge Summary       Patient ID: Jo Simpson MRN: [id number] DOB/AGE: February 06, 1946 78 y.o.  Admission Date: 04/02/2024 Discharge Date: 04/03/2024  Discharge Diagnoses:  Principal Problem:   PE (pulmonary thromboembolism) (HCC)   History of Present Illness: 78 year old female with osteoporosis was brought to the emergency department with the complaint of increasing shortness of breath for last 2 weeks and today it got worse while walking dog so she came to the emergency department for evaluation. She was noted to be hypoxic with O2 sat in 80s on room air, she was placed on 3 L nasal cannula oxygen, currently satting high 90s also she was noted to be tachycardic in 130s. CT angiogram chest was done which showed bilateral acute submassive PE with right heart strain, associated with pulmonary infarct. She was started on IV heparin infusion, PCCM was consulted for help evaluation medical management   Hospital Course:  Patient was admitted to ICU for acute submassive PE with RH strain and pulmonary infarct. She was started on IV Heparin infusion. Interventional radiology was consulted and evaluated the patient. She was taken for pulmonary artery thrombectomy with successful retrieval of clot burden. Initially, patient was on [facility] and subsequently on room air after procedure. She had subjective significant improvement in her breathing. No chest pain. No lightheadedness or presyncope. Tachycardia resolved. She did have a few episodes of VT that were self abating. On 5/5 she had follow-up echocardiogram showing RV dysfunction and LE duplex showing partially occlusive clot from R femoral downward. She was transitioned to oral Eliquis and provided with Eliquis teaching by pharmacist Hortensia Ma. She will be on lifelong therapy. She will also have repeat echo in 3 months and has been set up for follow-up appointment in pulmonology clinic in 3 months. She was ambulatory on room air with BP and HR  normal prior to discharge.   Discharge Plan by Active Problems:  Acute submassive PE s/p thrombectomy: lifelong anticoagulation with Eliquis   Significant Hospital Tests/Studies: CTA PE study  IR Thrombectomy  Echocardiogram   Consults: Interventional radiology  Pharmacy  Critical care medicine   Discharge Exam: BP (!) 109/33   Pulse 98   Temp 98.1 F (36.7 C) (Axillary)   Resp 19   Ht 5\' 2"  (1.575 m)   Wt 75.7 kg   SpO2 93%   BMI 30.52 kg/m   General: stable appearing in NAD  HEENT: [name]/AT, anicteric sclera, PERRL, moist mucous membranes. Neuro: Awake, oriented x 4. Responds to verbal stimuli. Following commands consistently. Moves all 4 extremities spontaneously. Strength 5/5 in all extremities.   CV: RRR, no m/g/r. PULM: Breathing even and unlabored on room air. Lung fields CTAB. GI: Soft, nontender, nondistended. Normoactive bowel sounds. Extremities: no LE edema noted. Skin: Warm/dry, intact.  Labs at Discharge: Lab Results  Component Value Date   CREATININE 0.76 04/03/2024   BUN 14 04/03/2024   NA 139 04/03/2024   K 4.0 04/03/2024   CL 112 (H) 04/03/2024   CO2 19 (L) 04/03/2024   Lab Results  Component Value Date   WBC 12.8 (H) 04/03/2024   HGB 13.7 04/03/2024   HCT 41.3 04/03/2024   MCV 93.7 04/03/2024   PLT 194 04/03/2024   No results found for: "ALT", "AST", "GGT", "ALKPHOS", "BILITOT" No results found for: "INR", "PROTIME"  Current Radiology Studies: IR THROMBECT VENO [facility] MOD SED Result Date: 04/03/2024 INDICATION: [age]-year-old female with history of acute, intermediate-high risk pulmonary embolism. EXAM: 1. Ultrasound-guided vascular access of  the right common femoral vein. 2. Selective catheterization and angiography of the bilateral pulmonary arteries. 3. Ultrasound-guided vascular access of the right internal jugular vein. 4. Aspiration thrombectomy of the bilateral pulmonary arteries. 5. Pulmonary manometry. COMPARISON:  CT a chest  from 04/02/2024 MEDICATIONS: 10000 units heparin, intravenous ANESTHESIA/SEDATION: Moderate (conscious) sedation was employed during this procedure. A total of Versed 4 mg and Fentanyl 200 mcg was administered intravenously. Moderate Sedation Time: 210 minutes. The patient's level of consciousness and vital signs were monitored continuously by radiology nursing throughout the procedure under my direct supervision. FLUOROSCOPY TIME:  Seven hundred eighty-two mGy COMPLICATIONS: None immediate. TECHNIQUE: Informed written consent was obtained from the patient after a thorough discussion of the procedural risks, benefits and alternatives. All questions were addressed. Maximal Sterile Barrier Technique was utilized including caps, mask, sterile gowns, sterile gloves, sterile drape, hand hygiene and skin antiseptic. A timeout was performed prior to the initiation of the procedure. Preprocedure ultrasound evaluation demonstrated patency of the right common femoral vein. The procedure was planned. Subdermal Local anesthesia was administered 1% lidocaine. A small skin nick was made. Under direct ultrasound visualization, the right common femoral vein was accessed with a 21 gauge micropuncture needle. A permanent ultrasound image was captured and stored in the record. Micropuncture sheath was inserted followed by placement of a Bentson wire was directed to the inferior vena cava under fluoroscopic guidance. Serial dilation was performed with an 8 [place] vascular sheath followed by placement of 2 ProGlides in a pre close fashion at the 10 o'clock and 2 o'clock positions. Over the wire, a 24 [place] Inari sheath was then placed and directed to the inferior vena cava. Over the wire, a double angle pigtail catheter was inserted and under fluoroscopic guidance directed through the right atrium and right ventricle however was unable to select the main pulmonary artery after multiple attempts with coaxial support sheaths. Therefore,  through the 24 [place] sheath, a 7 [place] Swan-Ganz catheter was inserted, the balloon inflated, and used to flow into the right pulmonary artery. With multiple attempted wire exchanges using multiple catheter, wire, and sheath coaxial combinations, access into the pulmonary arteries was unable to be preserved. Therefore, attempt from the femoral approach was aborted. The right common femoral vein sheath was removed and the tube ProGlide were tied achieving immediate hemostasis. The right neck was prepped and draped in standard fashion. Preprocedure ultrasound evaluation demonstrated patency of the right internal jugular vein. Small skin nick was made. Under direct ultrasound visualization, the right internal jugular vein was punctured with a 21 gauge micropuncture needle. A permanent ultrasound image was captured and stored in the record. Micropuncture sheath was introduced through which a Rosen wire was directed into the inferior vena cava. The micropuncture sheath was exchanged for an 8 [place], 10 cm vascular sheath. Through the sheath, the Swan-Ganz catheter was inserted and under fluoroscopic visualization flowed into the right pulmonary artery. The Swan-Ganz catheter was exchanged over a 0.025 inch J-wire for a 4 French glide catheter. The wire was then exchanged for a versa core wire over which a 6 [place] select catheter was placed. The wire was exchanged for a short taper superstiff Amplatz wire. The catheter was removed and the 8 French sheath was then exchanged for a 24 [place] in artery introducer sheath. A 24 [place] Flowtreiver aspiration catheter was then directed under fluoroscopic guidance to the right pulmonary artery. The inner dilator was removed. Multiple aspirations were performed which yielded a large volume of acute appearing thrombus.  From the main pulmonary artery, pulmonary angiogram was performed which demonstrated trace residual right upper lobe nonocclusive pulmonary embolism with excellent  diffuse right pulmonary artery perfusion. There is a perfusion defect in the left upper lobe however the left lower lobe and lingula demonstrated excellent perfusion. Pulmonary manometry was then performed which demonstrated mean pulmonary artery pressure of 28 mm Hg. The coaxial system was then directed to the left pulmonary artery in the wire was inserted into the left inferior lobar pulmonary artery. Aspiration thrombectomy was then performed which yielded trace acute appearing thrombus. As the final aspiration was performed, catheter position within the pulmonary artery was lost. Given the patient's improved tachycardia and oxygen saturations attempt at further pulmonary artery cannulation aspiration were not attempted. The catheters were removed. The sheath was then removed and hemostasis was achieved with manual compression. Sterile bandages were applied. The patient was transferred to the ICU in good condition. IMPRESSION: 1. Successful bilateral pulmonary artery thrombectomy with removal of large volume acute appearing thrombus, primarily from the right pulmonary arteries. 2. Pulmonary manometry demonstrated completion mean pulmonary artery pressure of 28 mm Hg. Creasie Doctor, MD [facility] Radiology Electronically Signed   By: Creasie Doctor M.D.   On: 04/03/2024 06:27   IR US  Guide Vasc Access Right Result Date: 04/03/2024 INDICATION: [age]-year-old female with history of acute, intermediate-high risk pulmonary embolism. EXAM: 1. Ultrasound-guided vascular access of the right common femoral vein. 2. Selective catheterization and angiography of the bilateral pulmonary arteries. 3. Ultrasound-guided vascular access of the right internal jugular vein. 4. Aspiration thrombectomy of the bilateral pulmonary arteries. 5. Pulmonary manometry. COMPARISON:  CT a chest from 04/02/2024 MEDICATIONS: 10000 units heparin, intravenous ANESTHESIA/SEDATION: Moderate  (conscious) sedation was employed during this procedure. A total of Versed 4 mg and Fentanyl 200 mcg was administered intravenously. Moderate Sedation Time: 210 minutes. The patient's level of consciousness and vital signs were monitored continuously by radiology nursing throughout the procedure under my direct supervision. FLUOROSCOPY TIME:  Seven hundred eighty-two mGy COMPLICATIONS: None immediate. TECHNIQUE: Informed written consent was obtained from the patient after a thorough discussion of the procedural risks, benefits and alternatives. All questions were addressed. Maximal Sterile Barrier Technique was utilized including caps, mask, sterile gowns, sterile gloves, sterile drape, hand hygiene and skin antiseptic. A timeout was performed prior to the initiation of the procedure. Preprocedure ultrasound evaluation demonstrated patency of the right common femoral vein. The procedure was planned. Subdermal Local anesthesia was administered 1% lidocaine. A small skin nick was made. Under direct ultrasound visualization, the right common femoral vein was accessed with a 21 gauge micropuncture needle. A permanent ultrasound image was captured and stored in the record. Micropuncture sheath was inserted followed by placement of a Bentson wire was directed to the inferior vena cava under fluoroscopic guidance. Serial dilation was performed with an 8 [place] vascular sheath followed by placement of 2 ProGlides in a pre close fashion at the 10 o'clock and 2 o'clock positions. Over the wire, a 24 [place] Inari sheath was then placed and directed to the inferior vena cava. Over the wire, a double angle pigtail catheter was inserted and under fluoroscopic guidance directed through the right atrium and right ventricle however was unable to select the main pulmonary artery after multiple attempts with coaxial support sheaths. Therefore, through the 24 [place] sheath, a 7 [place] Swan-Ganz catheter was inserted, the balloon  inflated, and used to flow into the right pulmonary artery. With multiple attempted wire exchanges using  multiple catheter, wire, and sheath coaxial combinations, access into the pulmonary arteries was unable to be preserved. Therefore, attempt from the femoral approach was aborted. The right common femoral vein sheath was removed and the tube ProGlide were tied achieving immediate hemostasis. The right neck was prepped and draped in standard fashion. Preprocedure ultrasound evaluation demonstrated patency of the right internal jugular vein. Small skin nick was made. Under direct ultrasound visualization, the right internal jugular vein was punctured with a 21 gauge micropuncture needle. A permanent ultrasound image was captured and stored in the record. Micropuncture sheath was introduced through which a Rosen wire was directed into the inferior vena cava. The micropuncture sheath was exchanged for an 8 [place], 10 cm vascular sheath. Through the sheath, the Swan-Ganz catheter was inserted and under fluoroscopic visualization flowed into the right pulmonary artery. The Swan-Ganz catheter was exchanged over a 0.025 inch J-wire for a 4 French glide catheter. The wire was then exchanged for a versa core wire over which a 6 [place] select catheter was placed. The wire was exchanged for a short taper superstiff Amplatz wire. The catheter was removed and the 8 French sheath was then exchanged for a 24 [place] in artery introducer sheath. A 24 [place] Flowtreiver aspiration catheter was then directed under fluoroscopic guidance to the right pulmonary artery. The inner dilator was removed. Multiple aspirations were performed which yielded a large volume of acute appearing thrombus. From the main pulmonary artery, pulmonary angiogram was performed which demonstrated trace residual right upper lobe nonocclusive pulmonary embolism with excellent diffuse right pulmonary artery perfusion. There is a perfusion defect in the left  upper lobe however the left lower lobe and lingula demonstrated excellent perfusion. Pulmonary manometry was then performed which demonstrated mean pulmonary artery pressure of 28 mm Hg. The coaxial system was then directed to the left pulmonary artery in the wire was inserted into the left inferior lobar pulmonary artery. Aspiration thrombectomy was then performed which yielded trace acute appearing thrombus. As the final aspiration was performed, catheter position within the pulmonary artery was lost. Given the patient's improved tachycardia and oxygen saturations attempt at further pulmonary artery cannulation aspiration were not attempted. The catheters were removed. The sheath was then removed and hemostasis was achieved with manual compression. Sterile bandages were applied. The patient was transferred to the ICU in good condition. IMPRESSION: 1. Successful bilateral pulmonary artery thrombectomy with removal of large volume acute appearing thrombus, primarily from the right pulmonary arteries. 2. Pulmonary manometry demonstrated completion mean pulmonary artery pressure of 28 mm Hg. Creasie Doctor, MD [facility] Radiology Electronically Signed   By: Creasie Doctor M.D.   On: 04/03/2024 06:27   IR Angiogram Pulmonary Bilateral Selective Result Date: 04/03/2024 INDICATION: [age]-year-old female with history of acute, intermediate-high risk pulmonary embolism. EXAM: 1. Ultrasound-guided vascular access of the right common femoral vein. 2. Selective catheterization and angiography of the bilateral pulmonary arteries. 3. Ultrasound-guided vascular access of the right internal jugular vein. 4. Aspiration thrombectomy of the bilateral pulmonary arteries. 5. Pulmonary manometry. COMPARISON:  CT a chest from 04/02/2024 MEDICATIONS: 10000 units heparin, intravenous ANESTHESIA/SEDATION: Moderate (conscious) sedation was employed during this procedure. A total of  Versed 4 mg and Fentanyl 200 mcg was administered intravenously. Moderate Sedation Time: 210 minutes. The patient's level of consciousness and vital signs were monitored continuously by radiology nursing throughout the procedure under my direct supervision. FLUOROSCOPY TIME:  Seven hundred eighty-two mGy COMPLICATIONS: None immediate. TECHNIQUE: Informed written consent was obtained  from the patient after a thorough discussion of the procedural risks, benefits and alternatives. All questions were addressed. Maximal Sterile Barrier Technique was utilized including caps, mask, sterile gowns, sterile gloves, sterile drape, hand hygiene and skin antiseptic. A timeout was performed prior to the initiation of the procedure. Preprocedure ultrasound evaluation demonstrated patency of the right common femoral vein. The procedure was planned. Subdermal Local anesthesia was administered 1% lidocaine. A small skin nick was made. Under direct ultrasound visualization, the right common femoral vein was accessed with a 21 gauge micropuncture needle. A permanent ultrasound image was captured and stored in the record. Micropuncture sheath was inserted followed by placement of a Bentson wire was directed to the inferior vena cava under fluoroscopic guidance. Serial dilation was performed with an 8 [place] vascular sheath followed by placement of 2 ProGlides in a pre close fashion at the 10 o'clock and 2 o'clock positions. Over the wire, a 24 [place] Inari sheath was then placed and directed to the inferior vena cava. Over the wire, a double angle pigtail catheter was inserted and under fluoroscopic guidance directed through the right atrium and right ventricle however was unable to select the main pulmonary artery after multiple attempts with coaxial support sheaths. Therefore, through the 24 [place] sheath, a 7 [place] Swan-Ganz catheter was inserted, the balloon inflated, and used to flow into the right pulmonary artery. With multiple  attempted wire exchanges using multiple catheter, wire, and sheath coaxial combinations, access into the pulmonary arteries was unable to be preserved. Therefore, attempt from the femoral approach was aborted. The right common femoral vein sheath was removed and the tube ProGlide were tied achieving immediate hemostasis. The right neck was prepped and draped in standard fashion. Preprocedure ultrasound evaluation demonstrated patency of the right internal jugular vein. Small skin nick was made. Under direct ultrasound visualization, the right internal jugular vein was punctured with a 21 gauge micropuncture needle. A permanent ultrasound image was captured and stored in the record. Micropuncture sheath was introduced through which a Rosen wire was directed into the inferior vena cava. The micropuncture sheath was exchanged for an 8 [place], 10 cm vascular sheath. Through the sheath, the Swan-Ganz catheter was inserted and under fluoroscopic visualization flowed into the right pulmonary artery. The Swan-Ganz catheter was exchanged over a 0.025 inch J-wire for a 4 French glide catheter. The wire was then exchanged for a versa core wire over which a 6 [place] select catheter was placed. The wire was exchanged for a short taper superstiff Amplatz wire. The catheter was removed and the 8 French sheath was then exchanged for a 24 [place] in artery introducer sheath. A 24 [place] Flowtreiver aspiration catheter was then directed under fluoroscopic guidance to the right pulmonary artery. The inner dilator was removed. Multiple aspirations were performed which yielded a large volume of acute appearing thrombus. From the main pulmonary artery, pulmonary angiogram was performed which demonstrated trace residual right upper lobe nonocclusive pulmonary embolism with excellent diffuse right pulmonary artery perfusion. There is a perfusion defect in the left upper lobe however the left lower lobe and lingula demonstrated excellent  perfusion. Pulmonary manometry was then performed which demonstrated mean pulmonary artery pressure of 28 mm Hg. The coaxial system was then directed to the left pulmonary artery in the wire was inserted into the left inferior lobar pulmonary artery. Aspiration thrombectomy was then performed which yielded trace acute appearing thrombus. As the final aspiration was performed, catheter position within the pulmonary artery was lost. Given the patient's  improved tachycardia and oxygen saturations attempt at further pulmonary artery cannulation aspiration were not attempted. The catheters were removed. The sheath was then removed and hemostasis was achieved with manual compression. Sterile bandages were applied. The patient was transferred to the ICU in good condition. IMPRESSION: 1. Successful bilateral pulmonary artery thrombectomy with removal of large volume acute appearing thrombus, primarily from the right pulmonary arteries. 2. Pulmonary manometry demonstrated completion mean pulmonary artery pressure of 28 mm Hg. Creasie Doctor, MD [facility] Radiology Electronically Signed   By: Creasie Doctor M.D.   On: 04/03/2024 06:27   IR US  Guide Vasc Access Right Result Date: 04/03/2024 INDICATION: [age]-year-old female with history of acute, intermediate-high risk pulmonary embolism. EXAM: 1. Ultrasound-guided vascular access of the right common femoral vein. 2. Selective catheterization and angiography of the bilateral pulmonary arteries. 3. Ultrasound-guided vascular access of the right internal jugular vein. 4. Aspiration thrombectomy of the bilateral pulmonary arteries. 5. Pulmonary manometry. COMPARISON:  CT a chest from 04/02/2024 MEDICATIONS: 10000 units heparin, intravenous ANESTHESIA/SEDATION: Moderate (conscious) sedation was employed during this procedure. A total of Versed 4 mg and Fentanyl 200 mcg was administered intravenously. Moderate Sedation Time:  210 minutes. The patient's level of consciousness and vital signs were monitored continuously by radiology nursing throughout the procedure under my direct supervision. FLUOROSCOPY TIME:  Seven hundred eighty-two mGy COMPLICATIONS: None immediate. TECHNIQUE: Informed written consent was obtained from the patient after a thorough discussion of the procedural risks, benefits and alternatives. All questions were addressed. Maximal Sterile Barrier Technique was utilized including caps, mask, sterile gowns, sterile gloves, sterile drape, hand hygiene and skin antiseptic. A timeout was performed prior to the initiation of the procedure. Preprocedure ultrasound evaluation demonstrated patency of the right common femoral vein. The procedure was planned. Subdermal Local anesthesia was administered 1% lidocaine. A small skin nick was made. Under direct ultrasound visualization, the right common femoral vein was accessed with a 21 gauge micropuncture needle. A permanent ultrasound image was captured and stored in the record. Micropuncture sheath was inserted followed by placement of a Bentson wire was directed to the inferior vena cava under fluoroscopic guidance. Serial dilation was performed with an 8 [place] vascular sheath followed by placement of 2 ProGlides in a pre close fashion at the 10 o'clock and 2 o'clock positions. Over the wire, a 24 [place] Inari sheath was then placed and directed to the inferior vena cava. Over the wire, a double angle pigtail catheter was inserted and under fluoroscopic guidance directed through the right atrium and right ventricle however was unable to select the main pulmonary artery after multiple attempts with coaxial support sheaths. Therefore, through the 24 [place] sheath, a 7 [place] Swan-Ganz catheter was inserted, the balloon inflated, and used to flow into the right pulmonary artery. With multiple attempted wire exchanges using multiple catheter, wire, and sheath coaxial combinations,  access into the pulmonary arteries was unable to be preserved. Therefore, attempt from the femoral approach was aborted. The right common femoral vein sheath was removed and the tube ProGlide were tied achieving immediate hemostasis. The right neck was prepped and draped in standard fashion. Preprocedure ultrasound evaluation demonstrated patency of the right internal jugular vein. Small skin nick was made. Under direct ultrasound visualization, the right internal jugular vein was punctured with a 21 gauge micropuncture needle. A permanent ultrasound image was captured and stored in the record. Micropuncture sheath was introduced through which a Rosen wire was directed into the inferior vena cava. The  micropuncture sheath was exchanged for an 8 [place], 10 cm vascular sheath. Through the sheath, the Swan-Ganz catheter was inserted and under fluoroscopic visualization flowed into the right pulmonary artery. The Swan-Ganz catheter was exchanged over a 0.025 inch J-wire for a 4 French glide catheter. The wire was then exchanged for a versa core wire over which a 6 [place] select catheter was placed. The wire was exchanged for a short taper superstiff Amplatz wire. The catheter was removed and the 8 French sheath was then exchanged for a 24 [place] in artery introducer sheath. A 24 [place] Flowtreiver aspiration catheter was then directed under fluoroscopic guidance to the right pulmonary artery. The inner dilator was removed. Multiple aspirations were performed which yielded a large volume of acute appearing thrombus. From the main pulmonary artery, pulmonary angiogram was performed which demonstrated trace residual right upper lobe nonocclusive pulmonary embolism with excellent diffuse right pulmonary artery perfusion. There is a perfusion defect in the left upper lobe however the left lower lobe and lingula demonstrated excellent perfusion. Pulmonary manometry was then performed which demonstrated mean pulmonary artery  pressure of 28 mm Hg. The coaxial system was then directed to the left pulmonary artery in the wire was inserted into the left inferior lobar pulmonary artery. Aspiration thrombectomy was then performed which yielded trace acute appearing thrombus. As the final aspiration was performed, catheter position within the pulmonary artery was lost. Given the patient's improved tachycardia and oxygen saturations attempt at further pulmonary artery cannulation aspiration were not attempted. The catheters were removed. The sheath was then removed and hemostasis was achieved with manual compression. Sterile bandages were applied. The patient was transferred to the ICU in good condition. IMPRESSION: 1. Successful bilateral pulmonary artery thrombectomy with removal of large volume acute appearing thrombus, primarily from the right pulmonary arteries. 2. Pulmonary manometry demonstrated completion mean pulmonary artery pressure of 28 mm Hg. Creasie Doctor, MD [facility] Radiology Electronically Signed   By: Creasie Doctor M.D.   On: 04/03/2024 06:27   IR Angiogram Selective Each Additional Vessel Result Date: 04/03/2024 INDICATION: [age]-year-old female with history of acute, intermediate-high risk pulmonary embolism. EXAM: 1. Ultrasound-guided vascular access of the right common femoral vein. 2. Selective catheterization and angiography of the bilateral pulmonary arteries. 3. Ultrasound-guided vascular access of the right internal jugular vein. 4. Aspiration thrombectomy of the bilateral pulmonary arteries. 5. Pulmonary manometry. COMPARISON:  CT a chest from 04/02/2024 MEDICATIONS: 10000 units heparin, intravenous ANESTHESIA/SEDATION: Moderate (conscious) sedation was employed during this procedure. A total of Versed 4 mg and Fentanyl 200 mcg was administered intravenously. Moderate Sedation Time: 210 minutes. The patient's level of consciousness and vital signs were  monitored continuously by radiology nursing throughout the procedure under my direct supervision. FLUOROSCOPY TIME:  Seven hundred eighty-two mGy COMPLICATIONS: None immediate. TECHNIQUE: Informed written consent was obtained from the patient after a thorough discussion of the procedural risks, benefits and alternatives. All questions were addressed. Maximal Sterile Barrier Technique was utilized including caps, mask, sterile gowns, sterile gloves, sterile drape, hand hygiene and skin antiseptic. A timeout was performed prior to the initiation of the procedure. Preprocedure ultrasound evaluation demonstrated patency of the right common femoral vein. The procedure was planned. Subdermal Local anesthesia was administered 1% lidocaine. A small skin nick was made. Under direct ultrasound visualization, the right common femoral vein was accessed with a 21 gauge micropuncture needle. A permanent ultrasound image was captured and stored in the record. Micropuncture sheath was inserted followed by placement  of a Bentson wire was directed to the inferior vena cava under fluoroscopic guidance. Serial dilation was performed with an 8 [place] vascular sheath followed by placement of 2 ProGlides in a pre close fashion at the 10 o'clock and 2 o'clock positions. Over the wire, a 24 [place] Inari sheath was then placed and directed to the inferior vena cava. Over the wire, a double angle pigtail catheter was inserted and under fluoroscopic guidance directed through the right atrium and right ventricle however was unable to select the main pulmonary artery after multiple attempts with coaxial support sheaths. Therefore, through the 24 [place] sheath, a 7 [place] Swan-Ganz catheter was inserted, the balloon inflated, and used to flow into the right pulmonary artery. With multiple attempted wire exchanges using multiple catheter, wire, and sheath coaxial combinations, access into the pulmonary arteries was unable to be preserved.  Therefore, attempt from the femoral approach was aborted. The right common femoral vein sheath was removed and the tube ProGlide were tied achieving immediate hemostasis. The right neck was prepped and draped in standard fashion. Preprocedure ultrasound evaluation demonstrated patency of the right internal jugular vein. Small skin nick was made. Under direct ultrasound visualization, the right internal jugular vein was punctured with a 21 gauge micropuncture needle. A permanent ultrasound image was captured and stored in the record. Micropuncture sheath was introduced through which a Rosen wire was directed into the inferior vena cava. The micropuncture sheath was exchanged for an 8 [place], 10 cm vascular sheath. Through the sheath, the Swan-Ganz catheter was inserted and under fluoroscopic visualization flowed into the right pulmonary artery. The Swan-Ganz catheter was exchanged over a 0.025 inch J-wire for a 4 French glide catheter. The wire was then exchanged for a versa core wire over which a 6 [place] select catheter was placed. The wire was exchanged for a short taper superstiff Amplatz wire. The catheter was removed and the 8 French sheath was then exchanged for a 24 [place] in artery introducer sheath. A 24 [place] Flowtreiver aspiration catheter was then directed under fluoroscopic guidance to the right pulmonary artery. The inner dilator was removed. Multiple aspirations were performed which yielded a large volume of acute appearing thrombus. From the main pulmonary artery, pulmonary angiogram was performed which demonstrated trace residual right upper lobe nonocclusive pulmonary embolism with excellent diffuse right pulmonary artery perfusion. There is a perfusion defect in the left upper lobe however the left lower lobe and lingula demonstrated excellent perfusion. Pulmonary manometry was then performed which demonstrated mean pulmonary artery pressure of 28 mm Hg. The coaxial system was then directed to  the left pulmonary artery in the wire was inserted into the left inferior lobar pulmonary artery. Aspiration thrombectomy was then performed which yielded trace acute appearing thrombus. As the final aspiration was performed, catheter position within the pulmonary artery was lost. Given the patient's improved tachycardia and oxygen saturations attempt at further pulmonary artery cannulation aspiration were not attempted. The catheters were removed. The sheath was then removed and hemostasis was achieved with manual compression. Sterile bandages were applied. The patient was transferred to the ICU in good condition. IMPRESSION: 1. Successful bilateral pulmonary artery thrombectomy with removal of large volume acute appearing thrombus, primarily from the right pulmonary arteries. 2. Pulmonary manometry demonstrated completion mean pulmonary artery pressure of 28 mm Hg. Creasie Doctor, MD [facility] Radiology Electronically Signed   By: Creasie Doctor M.D.   On: 04/03/2024 06:27   CT Angio Chest Pulmonary Embolism (PE) W  or WO Contrast Result Date: 04/02/2024 CLINICAL DATA:  Evaluate for pulmonary embolism. Positive D-dimer. Shortness of breath. EXAM: CT ANGIOGRAPHY CHEST WITH CONTRAST TECHNIQUE: Multidetector CT imaging of the chest was performed using the standard protocol during bolus administration of intravenous contrast. Multiplanar CT image reconstructions and MIPs were obtained to evaluate the vascular anatomy. RADIATION DOSE REDUCTION: This exam was performed according to the departmental dose-optimization program which includes automated exposure control, adjustment of the mA and/or kV according to patient size and/or use of iterative reconstruction technique. CONTRAST:  75mL OMNIPAQUE IOHEXOL 350 MG/ML SOLN COMPARISON:  None Available. FINDINGS: Cardiovascular: There is satisfactory opacification of the pulmonary arteries to the segmental level. Large clot  burden is identified within the right main pulmonary artery which extends into the right lower lobe, right upper lobe pulmonary arteries. There is also multiple filling defects within the proximal left upper lobar and segmental pulmonary arteries as well as the left lower lobe segmental pulmonary arteries. Signs of right heart strain identified with RV to LV ratio of 1.46. Mild cardiac enlargement. Aortic atherosclerosis and coronary artery calcifications. Mediastinum/Nodes: Thyroid  gland, trachea, and esophagus appear normal. Small hiatal hernia identified. No mediastinal or hilar adenopathy. Lungs/Pleura: No pleural effusion. Geographic area of ground-glass attenuation and mild consolidative change noted involving the superior segment of the left lower lobe, image 32/6. Mild pleural thickening and ground-glass attenuation is noted overlying the posteromedial right lower lobe, image 48/6. Upper Abdomen: No acute abnormality. Small calcifications noted within the upper pole of the left kidney, image 129/5. Gallstones. Musculoskeletal: No chest wall abnormality. No acute or significant osseous findings. Review of the MIP images confirms the above findings. IMPRESSION: 1. Examination is positive for acute bilateral pulmonary emboli with large clot burden within the right main pulmonary artery which extends into the right lower lobe, an right upper lobe pulmonary arteries. There is also multiple filling defects within the proximal left upper lobar and segmental pulmonary arteries as well as the left lower lobe segmental pulmonary arteries. 2. Signs of right heart strain identified with RV to LV ratio of 1.46. 3. Geographic area of ground-glass attenuation and mild consolidative change noted involving the superior segment of the left lower lobe. This is favored to represent pulmonary infarct. 4. Mild pleural thickening and ground-glass attenuation is noted overlying the posteromedial right lower lobe. This is favored to  represent atelectasis. 5. Gallstones. 6. Left nephrolithiasis. 7.  Aortic Atherosclerosis (ICD10-I70.0). Critical Value/emergent results were called by telephone at the time of interpretation on 04/02/2024 at 10:57 am to provider KAYLA KEITH , who verbally acknowledged these results. Electronically Signed   By: Kimberley Penman M.D.   On: 04/02/2024 10:58   DG Chest 2 View Result Date: 04/02/2024 CLINICAL DATA:  Shortness of breath. EXAM: CHEST - 2 VIEW COMPARISON:  None Available. FINDINGS: The heart size and mediastinal contours are within normal limits. Both lungs are clear. Probable old fracture deformity of distal right clavicle noted. IMPRESSION: No active cardiopulmonary disease. Electronically Signed   By: Marlyce Sine M.D.   On: 04/02/2024 08:57    Disposition:     Allergies as of 04/03/2024       Reactions   Sulfa Antibiotics Other (See Comments)   Childhood - unknown         Medication List     TAKE these medications    alendronate 70 MG tablet Commonly known as: FOSAMAX Take 70 mg by mouth once a week.   apixaban 5 MG Tabs tablet Commonly  known as: ELIQUIS Take 2 tablets (10 mg total) by mouth 2 (two) times daily for 7 days, THEN 1 tablet (5 mg total) 2 (two) times daily for 21 days. Start taking on: Apr 03, 2024   apixaban 5 MG Tabs tablet Commonly known as: Eliquis Take 2 tablets (10mg ) twice daily for 7 days, then 1 tablet (5mg ) twice daily   ibuprofen 200 MG tablet Commonly known as: ADVIL Take 200 mg by mouth daily as needed for mild pain (pain score 1-3) or moderate pain (pain score 4-6).   OVER THE COUNTER MEDICATION Take 2 tablets by mouth in the morning and at bedtime. Algecal plus   PRESERVISION AREDS PO Take 1 tablet by mouth in the morning and at bedtime.   VITAMIN D PO Take 1 capsule by mouth daily.        Follow-up Information     [facility] Follow up.   Why: Call them and let them know you were hospitalized for blood  clot; get appointment within next month to assure no further issues. Contact information: [address] [place] [address] [phone]                 Discharged Condition: good  30 minutes of time have been dedicated to discharge assessment, planning and discharge instructions.   Signed:  Arlys Lamer [facility] 04/03/24 12:32 PM  Please see [email or web address] for pager details.  From 7A-7P if no response, please call [phone] After hours, please call ELink [phone]

## 2024-04-03 NOTE — Progress Notes (Signed)
 Lower extremity venous duplex completed. Please see CV Procedures for preliminary results.  Initial findings reported to Peggi Bowels, Charity fundraiser.  Estanislao Heimlich, RVT 04/03/24 2:02 PM

## 2024-04-03 NOTE — Telephone Encounter (Signed)
 3 mo f/u with any for hospital PE followup thank you

## 2024-04-03 NOTE — Progress Notes (Signed)
 PHARMACY - ANTICOAGULATION Pharmacy Consult for heparin  Indication: pulmonary embolus Brief A/P: Heparin level subtherapeutic Increase Heparin rate  Allergies  Allergen Reactions   Sulfa Antibiotics     Patient Measurements: Height: 5\' 2"  (157.5 cm) Weight: 78.7 kg (173 lb 6.4 oz) IBW/kg (Calculated) : 50.1 HEPARIN DW (KG): 67.4  Vital Signs: Temp: 98.1 F (36.7 C) (05/05 0400) Temp Source: Oral (05/05 0400) BP: 130/63 (05/05 0330) Pulse Rate: 101 (05/05 0400)  Labs: Recent Labs    04/02/24 0811 04/02/24 1410 04/02/24 1952 04/03/24 0445  HGB 15.5*  --   --  13.7  HCT 47.5*  --   --  41.3  PLT 186  --   --  194  HEPARINUNFRC  --   --  >1.10* 0.16*  CREATININE 1.17*  --   --  0.76  TROPONINIHS  --  1,516*  --   --     Estimated Creatinine Clearance: 55.4 mL/min (by C-G formula based on SCr of 0.76 mg/dL).   Assessment: 78 y.o. female with PE s/p thrombectomy in IR for heparin  Goal of Therapy:  Heparin level 0.3-0.7 units/ml Monitor platelets by anticoagulation protocol: Yes   Plan:  Increase Heparin 1350 units/hr Check heparin level in 8 hours.   Claudine Cullens, PharmD, BCPS

## 2024-04-03 NOTE — Progress Notes (Signed)
 Echocardiogram 2D Echocardiogram has been performed.  Emmaline Haring Shawntell Dixson RDCS 04/03/2024, 1:35 PM

## 2024-04-03 NOTE — Telephone Encounter (Signed)
 Patient Product/process development scientist completed.    The patient is insured through [employer]. Patient has Medicare and is not eligible for a copay card, but may be able to apply for patient assistance or Medicare RX Payment Plan (Patient Must reach out to their plan, if eligible for payment plan), if available.    Ran test claim for Eliquis 5 mg and the current 30 day co-pay is $40.00.   This test claim was processed through [facility]- copay amounts may vary at other pharmacies due to pharmacy/plan contracts, or as the patient moves through the different stages of their insurance plan.     Roland Earl, CPHT Pharmacy Technician III Certified Patient Advocate [facility] Patient Advocate Team Direct Number: [phone]  Fax: [phone]

## 2024-04-04 NOTE — Telephone Encounter (Signed)
 Sending [facility] message.

## 2024-04-07 DIAGNOSIS — Z9889 Other specified postprocedural states: Secondary | ICD-10-CM | POA: Diagnosis not present

## 2024-04-07 DIAGNOSIS — D72829 Elevated white blood cell count, unspecified: Secondary | ICD-10-CM | POA: Diagnosis not present

## 2024-04-07 DIAGNOSIS — I82411 Acute embolism and thrombosis of right femoral vein: Secondary | ICD-10-CM | POA: Diagnosis not present

## 2024-04-07 DIAGNOSIS — R0982 Postnasal drip: Secondary | ICD-10-CM | POA: Diagnosis not present

## 2024-04-07 DIAGNOSIS — M810 Age-related osteoporosis without current pathological fracture: Secondary | ICD-10-CM | POA: Diagnosis not present

## 2024-04-07 DIAGNOSIS — Z86718 Personal history of other venous thrombosis and embolism: Secondary | ICD-10-CM | POA: Diagnosis not present

## 2024-04-07 DIAGNOSIS — I2699 Other pulmonary embolism without acute cor pulmonale: Secondary | ICD-10-CM | POA: Diagnosis not present

## 2024-06-27 DIAGNOSIS — I82411 Acute embolism and thrombosis of right femoral vein: Secondary | ICD-10-CM | POA: Diagnosis not present

## 2024-06-27 DIAGNOSIS — H6122 Impacted cerumen, left ear: Secondary | ICD-10-CM | POA: Diagnosis not present

## 2024-06-27 DIAGNOSIS — I700 Atherosclerosis of aorta: Secondary | ICD-10-CM | POA: Diagnosis not present

## 2024-06-27 DIAGNOSIS — I2699 Other pulmonary embolism without acute cor pulmonale: Secondary | ICD-10-CM | POA: Diagnosis not present

## 2024-06-27 DIAGNOSIS — Z0000 Encounter for general adult medical examination without abnormal findings: Secondary | ICD-10-CM | POA: Diagnosis not present

## 2024-06-27 DIAGNOSIS — M810 Age-related osteoporosis without current pathological fracture: Secondary | ICD-10-CM | POA: Diagnosis not present

## 2024-06-27 DIAGNOSIS — E669 Obesity, unspecified: Secondary | ICD-10-CM | POA: Diagnosis not present

## 2024-06-27 DIAGNOSIS — E782 Mixed hyperlipidemia: Secondary | ICD-10-CM | POA: Diagnosis not present

## 2024-06-27 DIAGNOSIS — Z7901 Long term (current) use of anticoagulants: Secondary | ICD-10-CM | POA: Diagnosis not present

## 2024-07-17 ENCOUNTER — Ambulatory Visit: Admitting: Pulmonary Disease

## 2024-07-17 ENCOUNTER — Encounter: Payer: Self-pay | Admitting: Pulmonary Disease

## 2024-07-17 VITALS — BP 157/85 | HR 83 | Temp 98.0°F | Ht 61.0 in | Wt 177.6 lb

## 2024-07-17 DIAGNOSIS — I2699 Other pulmonary embolism without acute cor pulmonale: Secondary | ICD-10-CM | POA: Diagnosis not present

## 2024-07-17 NOTE — Progress Notes (Signed)
 @Patient  ID: Jo Simpson, female    DOB: [birth year], 78 y.o.   MRN: [id number]  Chief Complaint  Patient presents with   [facility] f/u Pt states she had a blood clot in the pulmonary artery     Referring provider: Vernon Velna SAUNDERS, MD  HPI:   78 y.o. woman whom are seen in follow-up for submassive PE.  Multiple prior notes reviewed while in hospital.  Presented to hospital with 2 weeks of progressive shortness of breath.  Acutely worsened morning of presentation.  No clear inciting event.  CTA PE protocol revealed large clot burden with signs of increased RV.  Underwent TTE that showed increased RV size.  Underwent mechanical thrombectomy with marked improvement in symptoms.  Initially hypoxemic became off oxygen.  Discharged on apixaban .  Plan for lifelong use.  Her dyspnea is back to baseline.  She is back to walking her dog etc.  No real issue.  She reports good adherence to apixaban .  No side effects or significant bleeding noted.  We discussed at length additional test to further reassure ourselves that the blood clot has been well handled.  Her symptomatic improvement is quite reassuring on its own.  Questionaires / Pulmonary Flowsheets:   ACT:      No data to display          MMRC:     No data to display          Epworth:      No data to display          Tests:   FENO:  No results found for: NITRICOXIDE  PFT:     No data to display          WALK:      No data to display          Imaging: Personally reviewed and as per EMR in discussion this note No results found.  Lab Results: Personally reviewed CBC    Component Value Date/Time   WBC 12.8 (H) 04/03/2024 0445   RBC 4.41 04/03/2024 0445   HGB 13.7 04/03/2024 0445   HCT 41.3 04/03/2024 0445   PLT 194 04/03/2024 0445   MCV 93.7 04/03/2024 0445   MCH 31.1 04/03/2024 0445   MCHC 33.2 04/03/2024 0445   RDW 12.8 04/03/2024 0445   LYMPHSABS 1.3 04/02/2024 0811    MONOABS 0.9 04/02/2024 0811   EOSABS 0.0 04/02/2024 0811   BASOSABS 0.1 04/02/2024 0811    BMET    Component Value Date/Time   NA 139 04/03/2024 0445   K 4.0 04/03/2024 0445   CL 112 (H) 04/03/2024 0445   CO2 19 (L) 04/03/2024 0445   GLUCOSE 95 04/03/2024 0445   BUN 14 04/03/2024 0445   CREATININE 0.76 04/03/2024 0445   CALCIUM 8.3 (L) 04/03/2024 0445   GFRNONAA >60 04/03/2024 0445    BNP    Component Value Date/Time   BNP 118.0 (H) 04/02/2024 0817    ProBNP No results found for: PROBNP  Specialty Problems   None   Allergies  Allergen Reactions   Sulfa Antibiotics Other (See Comments)    Childhood - unknown     Immunization History  Administered Date(s) Administered   PFIZER(Purple Top)SARS-COV-2 Vaccination 12/25/2019, 01/15/2020   Tdap 02/08/2015    Past Medical History:  Diagnosis Date   Osteoporosis     Tobacco History: Social History   Tobacco Use  Smoking Status Never  Smokeless Tobacco Not on file  Counseling given: Not Answered   Continue to not smoke  Outpatient Encounter Medications as of 07/17/2024  Medication Sig   apixaban  (ELIQUIS ) 5 MG TABS tablet Take 1 tablet (5 mg total) by mouth 2 (two) times daily.   b complex vitamins capsule Take 1 capsule by mouth daily.   calcium carbonate (OSCAL) 1500 (600 Ca) MG TABS tablet Take 300 mg of elemental calcium by mouth 2 (two) times daily with a meal.   ibuprofen (ADVIL) 200 MG tablet Take 200 mg by mouth daily as needed for mild pain (pain score 1-3) or moderate pain (pain score 4-6).   Multiple Vitamins-Minerals (PRESERVISION AREDS PO) Take 1 tablet by mouth in the morning and at bedtime.   VITAMIN D PO Take 1 capsule by mouth daily.   [DISCONTINUED] alendronate (FOSAMAX) 70 MG tablet Take 70 mg by mouth once a week. (Patient not taking: Reported on 04/03/2024)   [DISCONTINUED] APIXABAN  (ELIQUIS ) VTE STARTER PACK (10MG  AND 5MG ) Take 10 mg by mouth 2 (two) times daily for 7 days, THEN 5 mg 2  (two) times daily for 23 days.   [DISCONTINUED] OVER THE COUNTER MEDICATION Take 2 tablets by mouth in the morning and at bedtime. Algecal plus   No facility-administered encounter medications on file as of 07/17/2024.     Review of Systems  Review of Systems  No chest pain restriction.  No orthopnea or PND.  Comprehensive review of systems otherwise negative. Physical Exam  BP (!) 157/85   Pulse 83   Temp 98 F (36.7 C)   Ht 5' 1 (1.549 m)   Wt 177 lb 9.6 oz (80.6 kg)   SpO2 97% Comment: RA  BMI 33.56 kg/m   Wt Readings from Last 5 Encounters:  07/17/24 177 lb 9.6 oz (80.6 kg)  04/03/24 166 lb 14.2 oz (75.7 kg)  10/29/13 191 lb (86.6 kg)    BMI Readings from Last 5 Encounters:  07/17/24 33.56 kg/m  04/03/24 30.52 kg/m  10/29/13 34.93 kg/m     Physical Exam General: Sitting in chair, no acute distress Eyes: EOMI, no icterus Neck: Supple, no JVP Pulmonary: Clear, normal work of breathing Cardiovascular regular rate and rhythm, no murmur Abdomen: Nondistended MSK: No synovitis, no joint effusions Neuro: Normal gait, no weakness Psych: Normal mood, full affect   Assessment & Plan:   Submassive pulmonary embolus: Presumed provoked.  Improved with anticoagulation in terms of dyspnea etc.  Recommend lifelong anticoagulation.  Repeat CTA PE protocol to assess for chronic clot ordered today.  RV enlargement: Suspect acute related to acute PE.  Repeat TTE ordered.   Return if symptoms worsen or fail to improve, for f/u Dr. Annella.   Jo JONELLE Annella, MD 07/17/2024   This appointment required 42 minutes of patient care (this includes precharting, chart review, review of results, face-to-face care, etc.).

## 2024-07-17 NOTE — Patient Instructions (Signed)
 It is nice to meet you  I agree with continuing the apixaban  indefinitely  To further reassure ourselves that the blood clots are gone, I have ordered echocardiogram (ultrasound the heart) and a CT angiogram of the chest PE protocol to evaluate for any small blood clots left behind.  I think both of these test will be improved and relatively normal.  This would be great news.  Return to clinic based on results, if everything normal, likely can follow-up as needed

## 2024-07-18 ENCOUNTER — Ambulatory Visit (HOSPITAL_COMMUNITY)
Admission: RE | Admit: 2024-07-18 | Discharge: 2024-07-18 | Disposition: A | Discharge status: Home / Self Care | Source: Ambulatory Visit | Attending: Pulmonary Disease | Admitting: Pulmonary Disease

## 2024-07-18 DIAGNOSIS — I2609 Other pulmonary embolism with acute cor pulmonale: Secondary | ICD-10-CM

## 2024-07-18 DIAGNOSIS — I2699 Other pulmonary embolism without acute cor pulmonale: Secondary | ICD-10-CM | POA: Insufficient documentation

## 2024-07-18 DIAGNOSIS — I080 Rheumatic disorders of both mitral and aortic valves: Secondary | ICD-10-CM | POA: Insufficient documentation

## 2024-07-18 LAB — ECHOCARDIOGRAM COMPLETE
Area-P 1/2: 4.45 cm2
S' Lateral: 3.1 cm

## 2024-07-19 ENCOUNTER — Encounter: Payer: Self-pay | Admitting: Radiology

## 2024-07-19 ENCOUNTER — Inpatient Hospital Stay
Admission: RE | Admit: 2024-07-19 | Discharge: 2024-07-19 | Discharge status: Home / Self Care | Source: Ambulatory Visit | Attending: Pulmonary Disease | Admitting: Pulmonary Disease

## 2024-07-19 DIAGNOSIS — I2699 Other pulmonary embolism without acute cor pulmonale: Secondary | ICD-10-CM

## 2024-07-19 DIAGNOSIS — I700 Atherosclerosis of aorta: Secondary | ICD-10-CM | POA: Diagnosis not present

## 2024-07-19 DIAGNOSIS — J8410 Pulmonary fibrosis, unspecified: Secondary | ICD-10-CM | POA: Diagnosis not present

## 2024-07-19 MED ORDER — IOPAMIDOL (ISOVUE-370) INJECTION 76%
75.0000 mL | Freq: Once | INTRAVENOUS | Status: AC | PRN
  Administered 2024-07-19: 75 mL via INTRAVENOUS

## 2024-07-20 ENCOUNTER — Other Ambulatory Visit

## 2024-09-06 DIAGNOSIS — R921 Mammographic calcification found on diagnostic imaging of breast: Secondary | ICD-10-CM | POA: Diagnosis not present

## 2024-09-27 DIAGNOSIS — Z86018 Personal history of other benign neoplasm: Secondary | ICD-10-CM | POA: Diagnosis not present

## 2024-09-27 DIAGNOSIS — D225 Melanocytic nevi of trunk: Secondary | ICD-10-CM | POA: Diagnosis not present

## 2024-09-27 DIAGNOSIS — L820 Inflamed seborrheic keratosis: Secondary | ICD-10-CM | POA: Diagnosis not present

## 2024-09-27 DIAGNOSIS — L578 Other skin changes due to chronic exposure to nonionizing radiation: Secondary | ICD-10-CM | POA: Diagnosis not present

## 2024-09-27 DIAGNOSIS — L814 Other melanin hyperpigmentation: Secondary | ICD-10-CM | POA: Diagnosis not present

## 2024-09-27 DIAGNOSIS — L821 Other seborrheic keratosis: Secondary | ICD-10-CM | POA: Diagnosis not present
